# Patient Record
Sex: Female | Born: 1985 | Marital: Married | Smoking: Never smoker
Health system: Southern US, Community
[De-identification: ages and names within clinical notes are randomized; demographics above are authoritative.]

## PROBLEM LIST (undated history)

## (undated) DIAGNOSIS — Z789 Other specified health status: Secondary | ICD-10-CM

## (undated) HISTORY — PX: TONSILLECTOMY: SUR1361

---

## 2016-02-01 ENCOUNTER — Emergency Department: Payer: Managed Care, Other (non HMO)

## 2016-02-01 ENCOUNTER — Emergency Department
Admission: EM | Admit: 2016-02-01 | Discharge: 2016-02-01 | Disposition: A | Discharge status: Home / Self Care | Payer: Managed Care, Other (non HMO) | Attending: Emergency Medicine | Admitting: Emergency Medicine

## 2016-02-01 ENCOUNTER — Encounter: Payer: Self-pay | Admitting: Emergency Medicine

## 2016-02-01 DIAGNOSIS — O039 Complete or unspecified spontaneous abortion without complication: Secondary | ICD-10-CM

## 2016-02-01 DIAGNOSIS — R102 Pelvic and perineal pain: Secondary | ICD-10-CM | POA: Diagnosis not present

## 2016-02-01 LAB — CBC
HCT: 33.1 % — ABNORMAL LOW (ref 35.0–47.0)
HEMOGLOBIN: 10.9 g/dL — AB (ref 12.0–16.0)
MCH: 23.8 pg — AB (ref 26.0–34.0)
MCHC: 32.9 g/dL (ref 32.0–36.0)
MCV: 72.3 fL — ABNORMAL LOW (ref 80.0–100.0)
PLATELETS: 228 10*3/uL (ref 150–440)
RBC: 4.58 MIL/uL (ref 3.80–5.20)
RDW: 22.3 % — AB (ref 11.5–14.5)
WBC: 5.3 10*3/uL (ref 3.6–11.0)

## 2016-02-01 LAB — BASIC METABOLIC PANEL
Anion gap: 7 (ref 5–15)
BUN: 15 mg/dL (ref 6–20)
CALCIUM: 9.2 mg/dL (ref 8.9–10.3)
CO2: 23 mmol/L (ref 22–32)
CREATININE: 0.72 mg/dL (ref 0.44–1.00)
Chloride: 108 mmol/L (ref 101–111)
Glucose, Bld: 92 mg/dL (ref 65–99)
Potassium: 4 mmol/L (ref 3.5–5.1)
SODIUM: 138 mmol/L (ref 135–145)

## 2016-02-01 LAB — TYPE AND SCREEN
ABO/RH(D): B POS
ANTIBODY SCREEN: NEGATIVE

## 2016-02-01 LAB — HCG, QUANTITATIVE, PREGNANCY: HCG, BETA CHAIN, QUANT, S: 564 m[IU]/mL — AB (ref <5)

## 2016-02-01 MED ORDER — SODIUM CHLORIDE 0.9 % IV SOLN
Freq: Once | INTRAVENOUS | Status: AC
  Administered 2016-02-01: 1 mL via INTRAVENOUS

## 2016-02-01 MED ORDER — MORPHINE SULFATE (PF) 4 MG/ML IV SOLN
4.0000 mg | Freq: Once | INTRAVENOUS | Status: DC
  Filled 2016-02-01: qty 1

## 2016-02-01 MED ORDER — MORPHINE SULFATE (PF) 4 MG/ML IV SOLN: 4.0000 mg | Freq: Once | INTRAVENOUS | Status: DC

## 2016-02-01 MED ORDER — VARENICLINE TARTRATE 0.5 MG PO TABS: ORAL_TABLET | ORAL | 2 refills | Status: DC

## 2016-02-01 MED ORDER — OXYCODONE-ACETAMINOPHEN 5-325 MG PO TABS: 2.0000 | ORAL_TABLET | Freq: Four times a day (QID) | ORAL | 0 refills | Status: DC | PRN

## 2016-02-01 NOTE — ED Triage Notes (Signed)
Labs as noted, pt triage 2.

## 2016-02-01 NOTE — ED Triage Notes (Signed)
Pt arrived via EMS from home for reports of miscarriage. Pt reports saw OB approximately one week ago and was told her baby's heart stopped beating at approximately 9 weeks. Pt is 12 weeks per husband. Pt was given oral misoprostol and began having vaginal bleeding one week ago. Pt reports bleeding had slowed down but now has increased and is passing fist sized clots. Pt denies pain.

## 2016-02-01 NOTE — ED Notes (Signed)
Patient transported to Ultrasound 

## 2016-02-01 NOTE — ED Provider Notes (Signed)
[facility] Emergency Department Provider Note        Time seen: ----------------------------------------- 6:50 PM on 02/01/2016 -----------------------------------------    I have reviewed the triage vital signs and the nursing notes.   HISTORY  Chief Complaint Vaginal Bleeding    HPI Rose Edwards is a 30 y.o. female who presents to ER for concerns of miscarriage. Patient reportedly had fetal demise diagnosed last week by [facility]. Patient states she was taking Meza processed all Friday and Saturday of last week and began having bleeding and passing clots. The bleeding had initially slowed down but is now increased and now she is passing large clots. She complains of lower abdominal pain and foul smell   No past medical history on file.  There are no active problems to display for this patient.   No past surgical history on file.  Allergies Patient has no known allergies.  Social History Social History  Substance Use Topics  . Smoking status: Not on file  . Smokeless tobacco: Not on file  . Alcohol use Not on file    Review of Systems Constitutional: Negative for fever. Cardiovascular: Negative for chest pain. Respiratory: Negative for shortness of breath. Gastrointestinal: Positive for pelvic pain Genitourinary: Negative for dysuria.Positive for vaginal bleeding Musculoskeletal: Negative for back pain. Skin: Negative for rash. Neurological: Negative for headaches, focal weakness or numbness.  10-point ROS otherwise negative.  ____________________________________________   PHYSICAL EXAM:  VITAL SIGNS: ED Triage Vitals [02/01/16 1741]  Enc Vitals Group     BP 113/68     Pulse Rate 84     Resp 18     Temp 98 F (36.7 C)     Temp Source Oral     SpO2 100 %     Weight 100 lb (45.4 kg)     Height 5\' 1"  (1.549 m)     Head Circumference      Peak Flow      Pain Score      Pain Loc      Pain Edu?      Excl. in [place]?     Constitutional: Alert and oriented. No distress Eyes: Conjunctivae are normal. PERRL. Normal extraocular movements. ENT   Head: Normocephalic and atraumatic.   Nose: No congestion/rhinnorhea.   Mouth/Throat: Mucous membranes are moist.   Neck: No stridor. Cardiovascular: Normal rate, regular rhythm. No murmurs, rubs, or gallops. Respiratory: Normal respiratory effort without tachypnea nor retractions. Breath sounds are clear and equal bilaterally. No wheezes/rales/rhonchi. Gastrointestinal: Suprapubic tenderness, no rebound or guarding. Normal bowel sounds. Genitourinary: Large clot is present in the cervix Musculoskeletal: Nontender with normal range of motion in all extremities. No lower extremity tenderness nor edema. Neurologic:  Normal speech and language. No gross focal neurologic deficits are appreciated.  Skin:  Skin is warm, dry and intact. No rash noted. Psychiatric: Mood and affect are normal. Speech and behavior are normal.  ____________________________________________  ED COURSE:  Pertinent labs & imaging results that were available during my care of the patient were reviewed by me and considered in my medical decision making (see chart for details). Clinical Course   Patient presents to the ER with recent miscarriage and heavy bleeding. We will assess with labs and ultrasound.  I was able to remove the large clot present within the cervix using ringed forceps  Procedures ____________________________________________   LABS (pertinent positives/negatives)  Labs Reviewed  HCG, QUANTITATIVE, PREGNANCY - Abnormal; Notable for the following:  Result Value   hCG, Beta Chain, Quant, S 564 (*)    All other components within normal limits  CBC - Abnormal; Notable for the following:    Hemoglobin 10.9 (*)    HCT 33.1 (*)    MCV 72.3 (*)    MCH 23.8 (*)    RDW 22.3 (*)    All other components within normal limits  BASIC METABOLIC PANEL  TYPE AND  SCREEN    RADIOLOGY  Pelvic ultrasound IMPRESSION: No intrauterine gestational sac is identified. Thickened and heterogeneous endometrium likely representing hemorrhagic products. No flow to suggest retained products of conception.  ____________________________________________  FINAL ASSESSMENT AND PLAN  Miscarriage  Plan: Patient with labs and imaging as dictated above. Patient likely with complete passage of her miscarriage. I removed a large blood clot from her cervix which was likely causing some of her symptoms. She is currently feeling better. She will be discharged with pain medicine and advised to follow-up with OB/GYN as an outpatient.   Earleen Newport, MD   Note: This dictation was prepared with Dragon dictation. Any transcriptional errors that result from this process are unintentional    Earleen Newport, MD 02/01/16 2159

## 2016-02-01 NOTE — ED Notes (Signed)
Pt. Going home with husband. 

## 2016-09-08 ENCOUNTER — Ambulatory Visit (INDEPENDENT_AMBULATORY_CARE_PROVIDER_SITE_OTHER): Payer: 59 | Admitting: Advanced Practice Midwife

## 2016-09-08 ENCOUNTER — Encounter: Payer: Self-pay | Admitting: Advanced Practice Midwife

## 2016-09-08 VITALS — BP 98/52 | Wt 108.0 lb

## 2016-09-08 DIAGNOSIS — Z3480 Encounter for supervision of other normal pregnancy, unspecified trimester: Secondary | ICD-10-CM

## 2016-09-08 DIAGNOSIS — Z1379 Encounter for other screening for genetic and chromosomal anomalies: Secondary | ICD-10-CM

## 2016-09-08 DIAGNOSIS — Z113 Encounter for screening for infections with a predominantly sexual mode of transmission: Secondary | ICD-10-CM

## 2016-09-08 DIAGNOSIS — Z98891 History of uterine scar from previous surgery: Secondary | ICD-10-CM

## 2016-09-08 DIAGNOSIS — Z3490 Encounter for supervision of normal pregnancy, unspecified, unspecified trimester: Secondary | ICD-10-CM

## 2016-09-08 DIAGNOSIS — O0990 Supervision of high risk pregnancy, unspecified, unspecified trimester: Secondary | ICD-10-CM | POA: Insufficient documentation

## 2016-09-08 NOTE — Progress Notes (Signed)
New Obstetric Patient H&P    Chief Complaint: "Desires prenatal care"   History of Present Illness: Patient is a 31 y.o. [place] female, LMP 07/23/16 presents with amenorrhea and positive home pregnancy test. Based on her  LMP, her EDD is Estimated Date of Delivery: 04/29/2017. and her EGA is [redacted]w[redacted]d. Cycles are 7. days, regular, and occur approximately every : 28 days. Her last pap smear was 8 months ago and was no abnormalities.    She had a urine pregnancy test which was positive 2 week(s)  ago. Her last menstrual period was normal and lasted for  7 day(s). Since her LMP she claims she has experienced breast tenderness, fatigue, nausea and vomiting. She denies vaginal bleeding. Her past medical history is noncontributory. Her prior pregnancies are notable for cesarean section  Since her LMP, she admits to the use of tobacco products  no She claims she has gained   no pounds since the start of her pregnancy.  There are cats in the home in the home  no  She admits close contact with children on a regular basis  yes  She has had chicken pox in the past yes She has had Tuberculosis exposures, symptoms, or previously tested positive for TB   no Current or past history of domestic violence. no  Genetic Screening/Teratology Counseling: (Includes patient, baby's father, or anyone in either family with:)   85. Patient's age >/= 28 [facility]  no 2. Thalassemia ([place], [place], [place], or Asian background): MCV<80  no 3. Neural tube defect (meningomyelocele, spina bifida, anencephaly)  no 4. Congenital heart defect  no  5. Down syndrome  no 6. Tay-Sachs (Jewish, [place])  no 7. Canavan's Disease  no 8. Sickle cell disease or trait (African)  no  9. Hemophilia or other blood disorders  no  10. Muscular dystrophy  no  11. Cystic fibrosis  no  12. Huntington's Chorea  no  13. Mental retardation/autism  no 14. Other inherited genetic or chromosomal disorder  no 15.  Maternal metabolic disorder (DM, PKU, etc)  no 16. Patient or FOB with a child with a birth defect not listed above no  16a. Patient or FOB with a birth defect themselves no 17. Recurrent pregnancy loss, or stillbirth  no  18. Any medications since LMP other than prenatal vitamins (include vitamins, supplements, OTC meds, drugs, alcohol)  no 19. Any other genetic/environmental exposure to discuss  no  Infection History:   1. Lives with someone with TB or TB exposed  no  2. Patient or partner has history of genital herpes  no 3. Rash or viral illness since LMP  no 4. History of STI (GC, CT, HPV, syphilis, HIV)  no 5. History of recent travel :  no  Other pertinent information:  no     Review of Systems:10 point review of systems negative unless otherwise noted in HPI  Past Medical History:  History reviewed. No pertinent past medical history.  Past Surgical History:  Past Surgical History:  Procedure Laterality Date  . CESAREAN SECTION  2012   [place]    Gynecologic History: Patient's last menstrual period was 11/08/2015.  Obstetric History: G3P1011  Family History:  Family History  Problem Relation Age of Onset  . Hypertension Mother     Social History:  Social History   Social History  . Marital status: Married    Spouse name: N/A  . Number of children: N/A  . Years of  education: N/A   Occupational History  . Not on file.   Social History Main Topics  . Smoking status: Never Smoker  . Smokeless tobacco: Never Used  . Alcohol use No  . Drug use: No  . Sexual activity: Yes    Birth control/ protection: None   Other Topics Concern  . Not on file   Social History Narrative  . No narrative on file    Allergies:  No Known Allergies  Medications: Prior to Admission medications   Not on File    Physical Exam Vitals: Blood pressure (!) 98/52, weight 108 lb (49 kg), last menstrual period 11/08/2015.  General: NAD HEENT: normocephalic,  anicteric Thyroid: no enlargement, no palpable nodules Pulmonary: No increased work of breathing, CTAB Cardiovascular: RRR, distal pulses 2+ Abdomen: NABS, soft, non-tender, non-distended.  Umbilicus without lesions.  No hepatomegaly, splenomegaly or masses palpable. No evidence of hernia  Genitourinary:  External: Normal external female genitalia.  Normal urethral meatus, normal  Bartholin's and Skene's glands.    Vagina: Normal vaginal mucosa, no evidence of prolapse.    Cervix: Grossly normal in appearance, no bleeding, no CMT  Uterus: Enlarged, mobile, normal contour.    Adnexa: ovaries non-enlarged, no adnexal masses  Rectal: deferred Extremities: no edema, erythema, or tenderness Neurologic: Grossly intact Psychiatric: mood appropriate, affect full   Assessment: 31 y.o. G3P1011 at [redacted]w[redacted]d by LMP presenting to initiate prenatal care  Plan: 1) Avoid alcoholic beverages. 2) Patient encouraged not to smoke.  3) Discontinue the use of all non-medicinal drugs and chemicals.  4) Take prenatal vitamins daily.  5) Nutrition, food safety (fish, cheese advisories, and high nitrite foods) and exercise discussed. 6) Hospital and practice style discussed with cross coverage system.  7) Genetic Screening, such as with 1st Trimester Screening, cell free fetal DNA, AFP testing, and Ultrasound, as well as with amniocentesis and CVS as appropriate, is discussed with patient. At the conclusion of today's visit patient declined genetic testing 8) Patient is asked about travel to areas at risk for the [place] virus, and counseled to avoid travel and exposure to mosquitoes or sexual partners who may have themselves been exposed to the virus. Testing is discussed, and will be ordered as appropriate.    Rod Can, CNM

## 2016-09-08 NOTE — Progress Notes (Signed)
NOB 

## 2016-09-08 NOTE — Patient Instructions (Signed)

## 2016-09-10 LAB — GC/CHLAMYDIA PROBE AMP
CHLAMYDIA, DNA PROBE: NEGATIVE
NEISSERIA GONORRHOEAE BY PCR: NEGATIVE

## 2016-09-10 LAB — URINE CULTURE: ORGANISM ID, BACTERIA: NO GROWTH

## 2016-09-12 LAB — RPR+RH+ABO+RUB AB+AB SCR+CB...
HEMATOCRIT: 37.2 % (ref 34.0–46.6)
HEMOGLOBIN: 12.2 g/dL (ref 11.1–15.9)
HEP B S AG: NEGATIVE
HIV Screen 4th Generation wRfx: NONREACTIVE
MCH: 25.1 pg — ABNORMAL LOW (ref 26.6–33.0)
MCHC: 32.8 g/dL (ref 31.5–35.7)
MCV: 76 fL — ABNORMAL LOW (ref 79–97)
Platelets: 288 10*3/uL (ref 150–379)
RBC: 4.87 x10E6/uL (ref 3.77–5.28)
RDW: 16.9 % — ABNORMAL HIGH (ref 12.3–15.4)
RH TYPE: POSITIVE
RPR: NONREACTIVE
Rubella Antibodies, IGG: 24.4 index (ref >0.99)
Varicella zoster IgG: 3078 index (ref >165)
WBC: 4.7 10*3/uL (ref 3.4–10.8)

## 2016-09-12 LAB — HEMOGLOBINOPATHY EVALUATION
HEMOGLOBIN A2 QUANTITATION: 2.2 % (ref 1.8–3.2)
HEMOGLOBIN F QUANTITATION: 0 % (ref 0.0–2.0)
HGB C: 0 %
HGB S: 0 %
HGB VARIANT: 0 %
Hgb A: 97.8 % (ref 96.4–98.8)

## 2016-09-12 LAB — AB SCR+ANTIBODY ID: Antibody Screen: POSITIVE — AB

## 2016-09-22 ENCOUNTER — Ambulatory Visit (INDEPENDENT_AMBULATORY_CARE_PROVIDER_SITE_OTHER): Payer: 59

## 2016-09-22 ENCOUNTER — Ambulatory Visit (INDEPENDENT_AMBULATORY_CARE_PROVIDER_SITE_OTHER): Payer: 59 | Admitting: Obstetrics and Gynecology

## 2016-09-22 VITALS — BP 100/62 | Wt 108.0 lb

## 2016-09-22 DIAGNOSIS — Z3A08 8 weeks gestation of pregnancy: Secondary | ICD-10-CM

## 2016-09-22 DIAGNOSIS — Z3490 Encounter for supervision of normal pregnancy, unspecified, unspecified trimester: Secondary | ICD-10-CM

## 2016-09-22 DIAGNOSIS — O0990 Supervision of high risk pregnancy, unspecified, unspecified trimester: Secondary | ICD-10-CM

## 2016-09-22 DIAGNOSIS — Z98891 History of uterine scar from previous surgery: Secondary | ICD-10-CM

## 2016-09-22 DIAGNOSIS — R768 Other specified abnormal immunological findings in serum: Secondary | ICD-10-CM

## 2016-09-22 DIAGNOSIS — D259 Leiomyoma of uterus, unspecified: Secondary | ICD-10-CM

## 2016-09-22 DIAGNOSIS — O3410 Maternal care for benign tumor of corpus uteri, unspecified trimester: Secondary | ICD-10-CM

## 2016-09-22 DIAGNOSIS — Z362 Encounter for other antenatal screening follow-up: Secondary | ICD-10-CM

## 2016-09-22 NOTE — Progress Notes (Signed)
Prenatal Visit Note Date: 09/22/2016 Clinic: [facility]  Subjective:  Rose Edwards is a 31 y.o. G3P1011 at [redacted]w[redacted]d being seen today for ongoing prenatal care.  She is currently monitored for the following issues for this high-risk pregnancy and has Supervision of high risk pregnancy, antepartum; History of cesarean section, unknown scar; Red blood cell antibody positive; and Uterine fibroid in pregnancy on her problem list.  Patient reports no bleeding.   Contractions: Not present. Vag. Bleeding: None.   . Denies leaking of fluid.   The following portions of the patient's history were reviewed and updated as appropriate: allergies, current medications, past family history, past medical history, past social history, past surgical history and problem list. Problem list updated.  Objective:   Vitals:   09/22/16 1500  BP: 100/62  Weight: 108 lb (49 kg)    Fetal Status: Fetal Heart Rate (bpm): present         General:  Alert, oriented and cooperative. Patient is in no acute distress.  Skin: Skin is warm and dry. No rash noted.   Cardiovascular: Normal heart rate noted  Respiratory: Normal respiratory effort, no problems with respiration noted  Abdomen: Soft, gravid, appropriate for gestational age. Pain/Pressure: Absent     Pelvic:  Cervical exam deferred        Extremities: Normal range of motion.  Edema: None  Mental Status: Normal mood and affect. Normal behavior. Normal judgment and thought content.   Urinalysis:      Assessment and Plan:  Pregnancy: G3P1011 at [redacted]w[redacted]d  1. Supervision of high risk pregnancy, antepartum - declines genetic screening -discussed fibroid on uterus and [facility]. No current bleeding 2. History of cesarean section, unknown scar 3. Red blood cell antibody positive Repeat in 4 weeks to verify 4. Uterine fibroid in pregnancy Discussed significance.  Continue to monitor 5. [redacted] weeks gestation of pregnancy F/u 4 weeks  Please refer to After Visit Summary for  other counseling recommendations.  Return in about 4 weeks (around 10/20/2016) for Routine Prenatal Appointment.  Prentice Docker, MD 09/22/2016 3:26 PM

## 2016-10-20 ENCOUNTER — Encounter: Payer: Self-pay | Admitting: Obstetrics & Gynecology

## 2016-10-20 ENCOUNTER — Ambulatory Visit (INDEPENDENT_AMBULATORY_CARE_PROVIDER_SITE_OTHER): Payer: 59 | Admitting: Obstetrics & Gynecology

## 2016-10-20 VITALS — BP 100/60 | Wt 106.0 lb

## 2016-10-20 DIAGNOSIS — Z3A12 12 weeks gestation of pregnancy: Secondary | ICD-10-CM

## 2016-10-20 DIAGNOSIS — O0990 Supervision of high risk pregnancy, unspecified, unspecified trimester: Secondary | ICD-10-CM

## 2016-10-20 DIAGNOSIS — O3410 Maternal care for benign tumor of corpus uteri, unspecified trimester: Secondary | ICD-10-CM

## 2016-10-20 DIAGNOSIS — Z98891 History of uterine scar from previous surgery: Secondary | ICD-10-CM

## 2016-10-20 DIAGNOSIS — R768 Other specified abnormal immunological findings in serum: Secondary | ICD-10-CM

## 2016-10-20 DIAGNOSIS — D259 Leiomyoma of uterus, unspecified: Secondary | ICD-10-CM

## 2016-10-20 NOTE — Progress Notes (Signed)
PNV, Repeat AB test today, plans CS

## 2016-10-20 NOTE — Patient Instructions (Signed)

## 2016-10-30 LAB — AB SCR+ANTIBODY ID: Antibody Screen: POSITIVE — AB

## 2016-10-30 LAB — ANTIBODY SCREEN

## 2016-11-16 ENCOUNTER — Ambulatory Visit (INDEPENDENT_AMBULATORY_CARE_PROVIDER_SITE_OTHER): Payer: 59 | Admitting: Maternal Newborn

## 2016-11-16 VITALS — BP 100/50 | Wt 111.0 lb

## 2016-11-16 DIAGNOSIS — Z98891 History of uterine scar from previous surgery: Secondary | ICD-10-CM

## 2016-11-16 DIAGNOSIS — O3410 Maternal care for benign tumor of corpus uteri, unspecified trimester: Secondary | ICD-10-CM

## 2016-11-16 DIAGNOSIS — O0990 Supervision of high risk pregnancy, unspecified, unspecified trimester: Secondary | ICD-10-CM

## 2016-11-16 DIAGNOSIS — R768 Other specified abnormal immunological findings in serum: Secondary | ICD-10-CM

## 2016-11-16 DIAGNOSIS — Z3A16 16 weeks gestation of pregnancy: Secondary | ICD-10-CM

## 2016-11-16 DIAGNOSIS — D259 Leiomyoma of uterus, unspecified: Secondary | ICD-10-CM

## 2016-11-16 NOTE — Progress Notes (Signed)
Routine Prenatal Care Visit  Subjective  Rose Edwards is a 31 y.o. G3P1011 at [redacted]w[redacted]d being seen today for ongoing prenatal care.  She is currently monitored for the following issues for this high-risk pregnancy and has Supervision of high risk pregnancy, antepartum; History of cesarean section, unknown scar; Red blood cell antibody positive; and Uterine fibroid in pregnancy on her problem list.  ----------------------------------------------------------------------------------- Patient reports no complaints.   Contractions: Not present. Vag. Bleeding: None. Denies leaking of fluid.  ----------------------------------------------------------------------------------- The following portions of the patient's history were reviewed and updated as appropriate: allergies, current medications, past family history, past medical history, past social history, past surgical history and problem list. Problem list updated.   Objective  Last menstrual period 07/23/2016. Pregravid weight 108 lb (49 kg) Total Weight Gain 3 lb (1.361 kg) Urinalysis: Urine Protein: Negative Urine Glucose: Negative  Fetal Status: Fetal Heart Rate (bpm): 144         General:  Alert, oriented and cooperative. Patient is in no acute distress.  Skin: Skin is warm and dry. No rash noted.   Cardiovascular: Normal heart rate noted  Respiratory: Normal respiratory effort, no problems with respiration noted  Abdomen: Soft, gravid, appropriate for gestational age. Pain/Pressure: Absent     Pelvic:  Cervical exam deferred        Extremities: Normal range of motion.  Edema: None  ental Status: Normal mood and affect. Normal behavior. Normal judgment and thought content.     Assessment   31 y.o. G4W1027 at [redacted]w[redacted]d by  04/29/2017, by Last Menstrual Period presenting for routine prenatal visit  Plan   Pregnancy#3 Problems (from 07/23/16 to present)    Problem Noted Resolved   Red blood cell antibody positive 09/22/2016 by Will Bonnet, MD No   Uterine fibroid in pregnancy 09/22/2016 by Will Bonnet, MD No   Supervision of high risk pregnancy, antepartum 09/08/2016 by Rod Can, CNM No   Overview Signed 09/08/2016  4:30 PM by Rod Can, [place] Prenatal Labs  Dating  Blood type: --/--/B POS (11/25 1744)   Genetic Screen 1 Screen:    AFP:     Quad:     NIPS: Antibody: Positive 8/14  Anatomic [place]  Rubella:   Varicella: Immune  GTT Early:               Third trimester:  RPR:   Non Reactive  Rhogam  HBsAg: Negative    TDaP vaccine                       Flu Shot: HIV:     Baby Food                                GBS:   Contraception  Pap:  CBB     CS/VBAC    Support Person              History of cesarean section, unknown scar 09/08/2016 by Rod Can, CNM No   Overview Signed 09/08/2016  4:31 PM by Rod Can, CNM    Surgery done in [place]          Preterm labor symptoms and general obstetric precautions including but not limited to vaginal bleeding, contractions, leaking of fluid and fetal movement were reviewed in detail with the patient. Please refer to After Visit Summary for other counseling recommendations.  Return in about 2 weeks (around 11/30/2016) for Anatomy US and [place].   Avel Sensor, CNM 11/16/2016  9:18 AM

## 2016-11-16 NOTE — Progress Notes (Signed)
No c/o

## 2016-11-17 ENCOUNTER — Encounter: Payer: 59 | Admitting: Obstetrics & Gynecology

## 2016-12-02 ENCOUNTER — Ambulatory Visit (INDEPENDENT_AMBULATORY_CARE_PROVIDER_SITE_OTHER): Payer: 59

## 2016-12-02 ENCOUNTER — Ambulatory Visit (INDEPENDENT_AMBULATORY_CARE_PROVIDER_SITE_OTHER): Payer: 59 | Admitting: Advanced Practice Midwife

## 2016-12-02 VITALS — BP 100/60 | Wt 111.0 lb

## 2016-12-02 DIAGNOSIS — Z362 Encounter for other antenatal screening follow-up: Secondary | ICD-10-CM | POA: Diagnosis not present

## 2016-12-02 DIAGNOSIS — O0990 Supervision of high risk pregnancy, unspecified, unspecified trimester: Secondary | ICD-10-CM | POA: Diagnosis not present

## 2016-12-02 DIAGNOSIS — Z3A18 18 weeks gestation of pregnancy: Secondary | ICD-10-CM

## 2016-12-02 NOTE — Progress Notes (Signed)
Anatomy scan today is complete. Uterine fibroid is unchanged. Complaint of sciatica pain. Recommend stretching, heat/ice, abdominal support band, hydrotherapy. She is unsure if she has felt fetal movement yet. She admits appetite but has only gained 3 pounds so far. She is encouraged to eat healthy foods and stay well hydrated. Denies contractions, LOF, VB. ROB in 4 weeks.

## 2016-12-30 ENCOUNTER — Ambulatory Visit (INDEPENDENT_AMBULATORY_CARE_PROVIDER_SITE_OTHER): Payer: 59 | Admitting: Maternal Newborn

## 2016-12-30 VITALS — BP 98/54 | Wt 118.0 lb

## 2016-12-30 DIAGNOSIS — R768 Other specified abnormal immunological findings in serum: Secondary | ICD-10-CM

## 2016-12-30 DIAGNOSIS — O0990 Supervision of high risk pregnancy, unspecified, unspecified trimester: Secondary | ICD-10-CM

## 2016-12-30 DIAGNOSIS — Z3A22 22 weeks gestation of pregnancy: Secondary | ICD-10-CM

## 2016-12-30 NOTE — Patient Instructions (Signed)

## 2016-12-30 NOTE — Progress Notes (Signed)
Routine Prenatal Care Visit  Subjective  Rose Edwards is a 31 y.o. G3P1011 at [redacted]w[redacted]d being seen today for ongoing prenatal care.  She is currently monitored for the following issues for this high-risk pregnancy and has Supervision of high risk pregnancy, antepartum; History of cesarean section, unknown scar; Red blood cell antibody positive; and Uterine fibroid in pregnancy on her problem list.  ----------------------------------------------------------------------------------- Patient reports some dizziness and headaches when abruptly standing up.  Contractions: Not present. Vag. Bleeding: None.  Movement: Present. Denies leaking of fluid.  ----------------------------------------------------------------------------------- The following portions of the patient's history were reviewed and updated as appropriate: allergies, current medications, past family history, past medical history, past social history, past surgical history and problem list. Problem list updated.   Objective  Blood pressure (!) 98/54, weight 118 lb (53.5 kg), last menstrual period 07/23/2016. Pregravid weight 108 lb (49 kg) Total Weight Gain 10 lb (4.536 kg) Urinalysis: Urine Protein: Negative Urine Glucose: Negative  Fetal Status: Fetal Heart Rate (bpm): 142 Fundal Height: 23 cm Movement: Present     General:  Alert, oriented and cooperative. Patient is in no acute distress.  Skin: Skin is warm and dry. No rash noted.   Cardiovascular: Normal heart rate noted  Respiratory: Normal respiratory effort, no problems with respiration noted  Abdomen: Soft, gravid, appropriate for gestational age. Pain/Pressure: Absent     Pelvic:  Cervical exam deferred        Extremities: Normal range of motion.  Edema: None  Mental Status: Normal mood and affect. Normal behavior. Normal judgment and thought content.     Assessment   32 y.o. [id number] at [redacted]w[redacted]d by  04/29/2017, by Last Menstrual Period presenting for routine prenatal  visit.  Plan   Pregnancy#3 Problems (from 07/23/16 to present)    Problem Noted Resolved   Red blood cell antibody positive 09/22/2016 by Will Bonnet, MD No   Uterine fibroid in pregnancy 09/22/2016 by Will Bonnet, MD No   Supervision of high risk pregnancy, antepartum 09/08/2016 by Rod Can, CNM No   Overview Addendum 11/16/2016  9:18 AM by Rexene Agent, [place] Prenatal Labs  Dating  Blood type: B/Positive/-- (07/03 1605)   Genetic Screen 1 Screen:    AFP:     Quad:     NIPS: Antibody:Positive, See Final Results (08/14 1116)  Anatomic [place]  Rubella: 24.40 (07/03 1605) Varicella: I  GTT Early:               Third trimester:  RPR: Non Reactive (07/03 1605)   Rhogam  HBsAg: Negative (07/03 1605)   TDaP vaccine                       Flu Shot: HIV:     Baby Food                                GBS:   Contraception  Pap:  CBB     CS/VBAC    Support Person              History of cesarean section, unknown scar 09/08/2016 by Rod Can, CNM No   Overview Signed 09/08/2016  4:31 PM by Rod Can, CNM    Surgery done in [place]       Advised good hydration and rising slowly to prevent postural hypotension symptoms.   Preterm labor symptoms and  general obstetric precautions including but not limited to vaginal bleeding, contractions, leaking of fluid and fetal movement were reviewed in detail with the patient. Please refer to After Visit Summary for other counseling recommendations.   Return in about 4 weeks (around 01/27/2017) for ROB.  Avel Sensor, CNM 12/30/2016  9:31 AM

## 2017-01-27 ENCOUNTER — Ambulatory Visit (INDEPENDENT_AMBULATORY_CARE_PROVIDER_SITE_OTHER): Payer: 59 | Admitting: Obstetrics and Gynecology

## 2017-01-27 VITALS — BP 96/50 | Wt 122.0 lb

## 2017-01-27 DIAGNOSIS — Z3A26 26 weeks gestation of pregnancy: Secondary | ICD-10-CM

## 2017-01-27 DIAGNOSIS — O0990 Supervision of high risk pregnancy, unspecified, unspecified trimester: Secondary | ICD-10-CM

## 2017-01-27 DIAGNOSIS — Z23 Encounter for immunization: Secondary | ICD-10-CM | POA: Diagnosis not present

## 2017-01-27 DIAGNOSIS — Z113 Encounter for screening for infections with a predominantly sexual mode of transmission: Secondary | ICD-10-CM

## 2017-01-27 NOTE — Progress Notes (Signed)
Routine Prenatal Care Visit  Subjective  Rose Edwards is a 31 y.o. G3P1011 at [redacted]w[redacted]d being seen today for ongoing prenatal care.  She is currently monitored for the following issues for this low-risk pregnancy and has Supervision of high risk pregnancy, antepartum; History of cesarean section, unknown scar; Red blood cell antibody positive; and Uterine fibroid in pregnancy on their problem list.  ----------------------------------------------------------------------------------- Patient reports no complaints.    .  .   . Denies leaking of fluid.  ----------------------------------------------------------------------------------- The following portions of the patient's history were reviewed and updated as appropriate: allergies, current medications, past family history, past medical history, past social history, past surgical history and problem list. Problem list updated.   Objective  Blood pressure (!) 96/50, weight 122 lb (55.3 kg), last menstrual period 07/23/2016. Pregravid weight 108 lb (49 kg) Total Weight Gain 14 lb (6.35 kg) Urinalysis:      Fetal Status:           General:  Alert, oriented and cooperative. Patient is in no acute distress.  Skin: Skin is warm and dry. No rash noted.   Cardiovascular: Normal heart rate noted  Respiratory: Normal respiratory effort, no problems with respiration noted  Abdomen: Soft, gravid, appropriate for gestational age.       Pelvic:  Cervical exam deferred        Extremities: Normal range of motion.     ental Status: Normal mood and affect. Normal behavior. Normal judgment and thought content.     Assessment   31 y.o. G3P1011 at [redacted]w[redacted]d by  04/29/2017, by Last Menstrual Period presenting for routine prenatal visit  Plan   Pregnancy#3 Problems (from 07/23/16 to present)    Problem Noted Resolved   Red blood cell antibody positive 09/22/2016 by Will Bonnet, MD No   Uterine fibroid in pregnancy 09/22/2016 by Will Bonnet, MD No    Supervision of high risk pregnancy, antepartum 09/08/2016 by Rod Can, CNM No   Overview Addendum 01/26/2017 10:05 PM by Malachy Mood, MD    Clinic [facility] Prenatal Labs  Dating LMP = 8 week [place] Blood type: B/Positive/-- (07/03 1605)   Genetic Screen Declined Antibody:Positive, See Final Results (08/14 1116)  Anatomic [place] Normal female, 7.5cm fibroid  Left posterior lower uterine segment Rubella: 24.40 (07/03 1605) Varicella: I  GTT Early:               Third trimester:  RPR: Non Reactive (07/03 1605)   Rhogam N/A HBsAg: Negative (07/03 1605)   TDaP vaccine                       Flu Shot: HIV: negative  Baby Food                                GBS:   Contraception  Pap: 12/31/2015 NIL  CBB     CS/VBAC    Support Person              History of cesarean section, unknown scar 09/08/2016 by Rod Can, CNM No   Overview Signed 09/08/2016  4:31 PM by Rod Can, CNM    Surgery done in [place]          Preterm labor symptoms and general obstetric precautions including but not limited to vaginal bleeding, contractions, leaking of fluid and fetal movement were reviewed in detail with the patient. Please refer to After  Visit Summary for other counseling recommendations.  - 28 week labs in 2 week with Rh negative panel to obtain repeat T&S - influenza vaccination today Return in about 2 weeks (around 02/10/2017) for ROB and 28 week labs.

## 2017-01-27 NOTE — Progress Notes (Signed)
ROB

## 2017-02-10 ENCOUNTER — Other Ambulatory Visit: Payer: 59

## 2017-02-10 ENCOUNTER — Encounter: Payer: 59 | Admitting: Maternal Newborn

## 2017-02-12 ENCOUNTER — Encounter: Payer: Self-pay | Admitting: Advanced Practice Midwife

## 2017-02-12 ENCOUNTER — Ambulatory Visit (INDEPENDENT_AMBULATORY_CARE_PROVIDER_SITE_OTHER): Payer: 59 | Admitting: Advanced Practice Midwife

## 2017-02-12 ENCOUNTER — Other Ambulatory Visit: Payer: 59

## 2017-02-12 VITALS — BP 94/58 | Wt 124.0 lb

## 2017-02-12 DIAGNOSIS — Z3A29 29 weeks gestation of pregnancy: Secondary | ICD-10-CM

## 2017-02-12 DIAGNOSIS — O0990 Supervision of high risk pregnancy, unspecified, unspecified trimester: Secondary | ICD-10-CM

## 2017-02-12 DIAGNOSIS — Z113 Encounter for screening for infections with a predominantly sexual mode of transmission: Secondary | ICD-10-CM

## 2017-02-12 NOTE — Progress Notes (Signed)
28 wk labs. No complaints.

## 2017-02-12 NOTE — Patient Instructions (Signed)
Third Trimester of Pregnancy The third trimester is from week 28 through week 40 (months 7 through 9). The third trimester is a time when the unborn baby (fetus) is growing rapidly. At the end of the ninth month, the fetus is about 20 inches in length and weighs 6-10 pounds. Body changes during your third trimester Your body will continue to go through many changes during pregnancy. The changes vary from woman to woman. During the third trimester:  Your weight will continue to increase. You can expect to gain 25-35 pounds (11-16 kg) by the end of the pregnancy.  You may begin to get stretch marks on your hips, abdomen, and breasts.  You may urinate more often because the fetus is moving lower into your pelvis and pressing on your bladder.  You may develop or continue to have heartburn. This is caused by increased hormones that slow down muscles in the digestive tract.  You may develop or continue to have constipation because increased hormones slow digestion and cause the muscles that push waste through your intestines to relax.  You may develop hemorrhoids. These are swollen veins (varicose veins) in the rectum that can itch or be painful.  You may develop swollen, bulging veins (varicose veins) in your legs.  You may have increased body aches in the pelvis, back, or thighs. This is due to weight gain and increased hormones that are relaxing your joints.  You may have changes in your hair. These can include thickening of your hair, rapid growth, and changes in texture. Some women also have hair loss during or after pregnancy, or hair that feels dry or thin. Your hair will most likely return to normal after your baby is born.  Your breasts will continue to grow and they will continue to become tender. A yellow fluid (colostrum) may leak from your breasts. This is the first milk you are producing for your baby.  Your belly button may stick out.  You may notice more swelling in your hands,  face, or ankles.  You may have increased tingling or numbness in your hands, arms, and legs. The skin on your belly may also feel numb.  You may feel short of breath because of your expanding uterus.  You may have more problems sleeping. This can be caused by the size of your belly, increased need to urinate, and an increase in your body's metabolism.  You may notice the fetus "dropping," or moving lower in your abdomen (lightening).  You may have increased vaginal discharge.  You may notice your joints feel loose and you may have pain around your pelvic bone.  What to expect at prenatal visits You will have prenatal exams every 2 weeks until week 36. Then you will have weekly prenatal exams. During a routine prenatal visit:  You will be weighed to make sure you and the baby are growing normally.  Your blood pressure will be taken.  Your abdomen will be measured to track your baby's growth.  The fetal heartbeat will be listened to.  Any test results from the previous visit will be discussed.  You may have a cervical check near your due date to see if your cervix has softened or thinned (effaced).  You will be tested for Group B streptococcus. This happens between 35 and 37 weeks.  Your health care provider may ask you:  What your birth plan is.  How you are feeling.  If you are feeling the baby move.  If you have had   any abnormal symptoms, such as leaking fluid, bleeding, severe headaches, or abdominal cramping.  If you are using any tobacco products, including cigarettes, chewing tobacco, and electronic cigarettes.  If you have any questions.  Other tests or screenings that may be performed during your third trimester include:  Blood tests that check for low iron levels (anemia).  Fetal testing to check the health, activity level, and growth of the fetus. Testing is done if you have certain medical conditions or if there are problems during the  pregnancy.  Nonstress test (NST). This test checks the health of your baby to make sure there are no signs of problems, such as the baby not getting enough oxygen. During this test, a belt is placed around your belly. The baby is made to move, and its heart rate is monitored during movement.  What is false labor? False labor is a condition in which you feel small, irregular tightenings of the muscles in the womb (contractions) that usually go away with rest, changing position, or drinking water. These are called Braxton Hicks contractions. Contractions may last for hours, days, or even weeks before true labor sets in. If contractions come at regular intervals, become more frequent, increase in intensity, or become painful, you should see your health care provider. What are the signs of labor?  Abdominal cramps.  Regular contractions that start at 10 minutes apart and become stronger and more frequent with time.  Contractions that start on the top of the uterus and spread down to the lower abdomen and back.  Increased pelvic pressure and dull back pain.  A watery or bloody mucus discharge that comes from the vagina.  Leaking of amniotic fluid. This is also known as your "water breaking." It could be a slow trickle or a gush. Let your health care provider know if it has a color or strange odor. If you have any of these signs, call your health care provider right away, even if it is before your due date. Follow these instructions at home: Medicines  Follow your health care provider's instructions regarding medicine use. Specific medicines may be either safe or unsafe to take during pregnancy.  Take a prenatal vitamin that contains at least 600 micrograms (mcg) of folic acid.  If you develop constipation, try taking a stool softener if your health care provider approves. Eating and drinking  Eat a balanced diet that includes fresh fruits and vegetables, whole grains, good sources of protein  such as meat, eggs, or tofu, and low-fat dairy. Your health care provider will help you determine the amount of weight gain that is right for you.  Avoid raw meat and uncooked cheese. These carry germs that can cause birth defects in the baby.  If you have low calcium intake from food, talk to your health care provider about whether you should take a daily calcium supplement.  Eat four or five small meals rather than three large meals a day.  Limit foods that are high in fat and processed sugars, such as fried and sweet foods.  To prevent constipation: ? Drink enough fluid to keep your urine clear or pale yellow. ? Eat foods that are high in fiber, such as fresh fruits and vegetables, whole grains, and beans. Activity  Exercise only as directed by your health care provider. Most women can continue their usual exercise routine during pregnancy. Try to exercise for 30 minutes at least 5 days a week. Stop exercising if you experience uterine contractions.  Avoid heavy   lifting.  Do not exercise in extreme heat or humidity, or at high altitudes.  Wear low-heel, comfortable shoes.  Practice good posture.  You may continue to have sex unless your health care provider tells you otherwise. Relieving pain and discomfort  Take frequent breaks and rest with your legs elevated if you have leg cramps or low back pain.  Take warm sitz baths to soothe any pain or discomfort caused by hemorrhoids. Use hemorrhoid cream if your health care provider approves.  Wear a good support bra to prevent discomfort from breast tenderness.  If you develop varicose veins: ? Wear support pantyhose or compression stockings as told by your healthcare provider. ? Elevate your feet for 15 minutes, 3-4 times a day. Prenatal care  Write down your questions. Take them to your prenatal visits.  Keep all your prenatal visits as told by your health care provider. This is important. Safety  Wear your seat belt at  all times when driving.  Make a list of emergency phone numbers, including numbers for family, friends, the hospital, and police and fire departments. General instructions  Avoid cat litter boxes and soil used by cats. These carry germs that can cause birth defects in the baby. If you have a cat, ask someone to clean the litter box for you.  Do not travel far distances unless it is absolutely necessary and only with the approval of your health care provider.  Do not use hot tubs, steam rooms, or saunas.  Do not drink alcohol.  Do not use any products that contain nicotine or tobacco, such as cigarettes and e-cigarettes. If you need help quitting, ask your health care provider.  Do not use any medicinal herbs or unprescribed drugs. These chemicals affect the formation and growth of the baby.  Do not douche or use tampons or scented sanitary pads.  Do not cross your legs for long periods of time.  To prepare for the arrival of your baby: ? Take prenatal classes to understand, practice, and ask questions about labor and delivery. ? Make a trial run to the hospital. ? Visit the hospital and tour the maternity area. ? Arrange for maternity or paternity leave through employers. ? Arrange for family and friends to take care of pets while you are in the hospital. ? Purchase a rear-facing car seat and make sure you know how to install it in your car. ? Pack your hospital bag. ? Prepare the baby's nursery. Make sure to remove all pillows and stuffed animals from the baby's crib to prevent suffocation.  Visit your dentist if you have not gone during your pregnancy. Use a soft toothbrush to brush your teeth and be gentle when you floss. Contact a health care provider if:  You are unsure if you are in labor or if your water has broken.  You become dizzy.  You have mild pelvic cramps, pelvic pressure, or nagging pain in your abdominal area.  You have lower back pain.  You have persistent  nausea, vomiting, or diarrhea.  You have an unusual or bad smelling vaginal discharge.  You have pain when you urinate. Get help right away if:  Your water breaks before 37 weeks.  You have regular contractions less than 5 minutes apart before 37 weeks.  You have a fever.  You are leaking fluid from your vagina.  You have spotting or bleeding from your vagina.  You have severe abdominal pain or cramping.  You have rapid weight loss or weight gain.    You have shortness of breath with chest pain.  You notice sudden or extreme swelling of your face, hands, ankles, feet, or legs.  Your baby makes fewer than 10 movements in 2 hours.  You have severe headaches that do not go away when you take medicine.  You have vision changes. Summary  The third trimester is from week 28 through week 40, months 7 through 9. The third trimester is a time when the unborn baby (fetus) is growing rapidly.  During the third trimester, your discomfort may increase as you and your baby continue to gain weight. You may have abdominal, leg, and back pain, sleeping problems, and an increased need to urinate.  During the third trimester your breasts will keep growing and they will continue to become tender. A yellow fluid (colostrum) may leak from your breasts. This is the first milk you are producing for your baby.  False labor is a condition in which you feel small, irregular tightenings of the muscles in the womb (contractions) that eventually go away. These are called Braxton Hicks contractions. Contractions may last for hours, days, or even weeks before true labor sets in.  Signs of labor can include: abdominal cramps; regular contractions that start at 10 minutes apart and become stronger and more frequent with time; watery or bloody mucus discharge that comes from the vagina; increased pelvic pressure and dull back pain; and leaking of amniotic fluid. This information is not intended to replace advice  given to you by your health care provider. Make sure you discuss any questions you have with your health care provider. Document Released: 02/17/2001 Document Revised: 08/01/2015 Document Reviewed: 04/26/2012 Elsevier Interactive Patient Education  2017 [employer]  

## 2017-02-12 NOTE — Progress Notes (Signed)
Routine Prenatal Care Visit  Subjective  Rose Edwards is a 31 y.o. G3P1011 at [redacted]w[redacted]d being seen today for ongoing prenatal care.  She is currently monitored for the following issues for this high-risk pregnancy and has Supervision of high risk pregnancy, antepartum; History of cesarean section, unknown scar; Red blood cell antibody positive; and Uterine fibroid in pregnancy on their problem list.  ----------------------------------------------------------------------------------- Patient reports no complaints.   Contractions: Not present.  .  Movement: Present. Denies leaking of fluid. Denies vaginal bleeding. ----------------------------------------------------------------------------------- The following portions of the patient's history were reviewed and updated as appropriate: allergies, current medications, past family history, past medical history, past social history, past surgical history and problem list. Problem list updated.   Objective  Blood pressure (!) 94/58, weight 124 lb (56.2 kg), last menstrual period 07/23/2016. Pregravid weight 108 lb (49 kg) Total Weight Gain 16 lb (7.258 kg) Urinalysis: Urine Protein: Negative Urine Glucose: Negative  Fetal Status: Fetal Heart Rate (bpm): 136 Fundal Height: 29 cm Movement: Present     General:  Alert, oriented and cooperative. Patient is in no acute distress.  Skin: Skin is warm and dry. No rash noted.   Cardiovascular: Normal heart rate noted  Respiratory: Normal respiratory effort, no problems with respiration noted  Abdomen: Soft, gravid, appropriate for gestational age. Pain/Pressure: Absent     Pelvic:  Cervical exam deferred        Extremities: Normal range of motion.     Mental Status: Normal mood and affect. Normal behavior. Normal judgment and thought content.   Assessment   31 y.o. G3P1011 at [redacted]w[redacted]d by  04/29/2017, by Last Menstrual Period presenting for routine prenatal visit  Plan   Pregnancy#3 Problems (from 07/23/16  to present)    Problem Noted Resolved   Red blood cell antibody positive 09/22/2016 by Will Bonnet, MD No   Uterine fibroid in pregnancy 09/22/2016 by Will Bonnet, MD No   Supervision of high risk pregnancy, antepartum 09/08/2016 by Rod Can, CNM No   Overview Addendum 01/27/2017  9:13 AM by Malachy Mood, MD    [facility]inic [facility] Prenatal Labs  Dating LMP = 8 week [place] Blood type: B/Positive/-- (07/03 1605)   Genetic Screen Declined Antibody:Positive, See Final Results (08/14 1116)  Anatomic [place] Normal female, 7.5cm fibroid  Left posterior lower uterine segment Rubella: 24.40 (07/03 1605) Varicella: I  GTT Third trimester: 12/7 RPR: Non Reactive (07/03 1605)   Rhogam N/A HBsAg: Negative (07/03 1605)   TDaP vaccine                       Flu Shot: 01/27/17 HIV: negative  Baby Food                                GBS:   Contraception  Pap: 12/31/2015 NIL  CBB     CS/VBAC Repeat sched 04/22/2017   Support Person husband             History of cesarean section, unknown scar 09/08/2016 by Rod Can, CNM No   Overview Signed 09/08/2016  4:31 PM by Rod Can, CNM    Surgery done in [place]          Preterm labor symptoms and general obstetric precautions including but not limited to vaginal bleeding, contractions, leaking of fluid and fetal movement were reviewed in detail with the patient. Please refer to After Visit Summary for other counseling  recommendations.   Return in about 2 weeks (around 02/26/2017) for rob.  Rod Can, CNM  02/12/2017 9:34 AM

## 2017-02-13 LAB — 28 WEEKS RH-PANEL
Antibody Screen: NEGATIVE
Basophils Absolute: 0 10*3/uL (ref 0.0–0.2)
Basos: 0 %
EOS (ABSOLUTE): 0.1 10*3/uL (ref 0.0–0.4)
EOS: 2 %
GESTATIONAL DIABETES SCREEN: 132 mg/dL (ref 65–139)
HEMATOCRIT: 32.9 % — AB (ref 34.0–46.6)
HEMOGLOBIN: 10.8 g/dL — AB (ref 11.1–15.9)
HIV SCREEN 4TH GENERATION: NONREACTIVE
Immature Grans (Abs): 0 10*3/uL (ref 0.0–0.1)
Immature Granulocytes: 1 %
LYMPHS ABS: 1 10*3/uL (ref 0.7–3.1)
Lymphs: 18 %
MCH: 26.5 pg — AB (ref 26.6–33.0)
MCHC: 32.8 g/dL (ref 31.5–35.7)
MCV: 81 fL (ref 79–97)
Monocytes Absolute: 0.3 10*3/uL (ref 0.1–0.9)
Monocytes: 5 %
NEUTROS ABS: 4.1 10*3/uL (ref 1.4–7.0)
Neutrophils: 74 %
PLATELETS: 153 10*3/uL (ref 150–379)
RBC: 4.08 x10E6/uL (ref 3.77–5.28)
RDW: 15.3 % (ref 12.3–15.4)
RPR: NONREACTIVE
WBC: 5.6 10*3/uL (ref 3.4–10.8)

## 2017-02-18 ENCOUNTER — Telehealth: Payer: Self-pay | Admitting: Obstetrics & Gynecology

## 2017-02-18 NOTE — Telephone Encounter (Signed)
Lmtrc

## 2017-02-18 NOTE — Telephone Encounter (Signed)
Patient's husband, Inda Merlin (on [place]), is aware.

## 2017-02-18 NOTE — Telephone Encounter (Signed)
-----   Message from Rod Can, CNM sent at 02/12/2017  2:23 PM EST ----- Surgery Booking Request Patient Full Name:  Rose Edwards MRN: [id number]  DOB: October 22, 1985  Surgeon: Georgianne Fick or Kenton Kingfisher (both are in the [place] but Georgianne Fick is also on call)  Requested Surgery Date and Time: 04/22/2017 Primary Diagnosis AND Code: Repeat c/s Secondary Diagnosis and Code:  Surgical Procedure: Cesarean Section L&D Notification: Yes Admission Status: surgery admit Length of Surgery: 95 m Special Case Needs: On Q pump H&P:  (date) Phone Interview???:  Interpreter: Language:  Medical Clearance:  Special Scheduling Instructions:

## 2017-02-26 ENCOUNTER — Ambulatory Visit (INDEPENDENT_AMBULATORY_CARE_PROVIDER_SITE_OTHER): Payer: 59 | Admitting: Obstetrics and Gynecology

## 2017-02-26 VITALS — BP 104/66 | Wt 128.0 lb

## 2017-02-26 DIAGNOSIS — O0990 Supervision of high risk pregnancy, unspecified, unspecified trimester: Secondary | ICD-10-CM | POA: Diagnosis not present

## 2017-02-26 DIAGNOSIS — Z3A30 30 weeks gestation of pregnancy: Secondary | ICD-10-CM

## 2017-02-26 DIAGNOSIS — O26843 Uterine size-date discrepancy, third trimester: Secondary | ICD-10-CM

## 2017-02-26 DIAGNOSIS — Z23 Encounter for immunization: Secondary | ICD-10-CM | POA: Diagnosis not present

## 2017-02-26 NOTE — Progress Notes (Signed)
Routine Prenatal Care Visit  Subjective  Rose Edwards is a 31 y.o. G3P1011 at [redacted]w[redacted]d being seen today for ongoing prenatal care.  She is currently monitored for the following issues for this high-risk pregnancy and has Supervision of high risk pregnancy, antepartum; History of cesarean section, unknown scar; Red blood cell antibody positive; and Uterine fibroid in pregnancy on their problem list.  ----------------------------------------------------------------------------------- Patient reports no complaints.   Contractions: Not present. Vag. Bleeding: None.  Movement: Present. Denies leaking of fluid.  ----------------------------------------------------------------------------------- The following portions of the patient's history were reviewed and updated as appropriate: allergies, current medications, past family history, past medical history, past social history, past surgical history and problem list. Problem list updated.   Objective  Blood pressure 104/66, weight 128 lb (58.1 kg), last menstrual period 07/23/2016. Pregravid weight 108 lb (49 kg) Total Weight Gain 20 lb (9.072 kg) Urinalysis: Urine Protein: Negative Urine Glucose: Negative  Fetal Status: Fetal Heart Rate (bpm): 140 Fundal Height: 28 cm Movement: Present     General:  Alert, oriented and cooperative. Patient is in no acute distress.  Skin: Skin is warm and dry. No rash noted.   Cardiovascular: Normal heart rate noted  Respiratory: Normal respiratory effort, no problems with respiration noted  Abdomen: Soft, gravid, appropriate for gestational age. Pain/Pressure: Absent     Pelvic:  Cervical exam deferred        Extremities: Normal range of motion.     ental Status: Normal mood and affect. Normal behavior. Normal judgment and thought content.     Assessment   31 y.o. [id number] at [redacted]w[redacted]d by  04/29/2017, by Last Menstrual Period presenting for routine prenatal visit  Plan   Pregnancy#3 Problems (from 07/23/16 to  present)    Problem Noted Resolved   Red blood cell antibody positive 09/22/2016 by Will Bonnet, MD No   Uterine fibroid in pregnancy 09/22/2016 by Will Bonnet, MD No   Supervision of high risk pregnancy, antepartum 09/08/2016 by Rod Can, CNM No   Overview Addendum 02/26/2017 10:09 AM by Malachy Mood, MD    [facility] Prenatal Labs  Dating LMP = 8 week [place] Blood type: B/Positive/-- (07/03 1605)   Genetic Screen Declined Antibody:Positive, See Final Results (08/14 1116)  Anatomic [place] Normal female, 7.5cm fibroid  Left posterior lower uterine segment Rubella: 24.40 (07/03 1605) Varicella: I  GTT Third trimester: 132 RPR: Non Reactive (07/03 1605)   Rhogam N/A HBsAg: Negative (07/03 1605)   TDaP vaccine 02/26/17 Flu Shot: 01/27/17 HIV: negative  Baby Food                                GBS:   Contraception  Pap: 12/31/2015 NIL  CBB     CS/VBAC    Support Person              History of cesarean section, unknown scar 09/08/2016 by Rod Can, CNM No   Overview Signed 09/08/2016  4:31 PM by Rod Can, CNM    Surgery done in [place]          Preterm labor symptoms and general obstetric precautions including but not limited to vaginal bleeding, contractions, leaking of fluid and fetal movement were reviewed in detail with the patient. Please refer to After Visit Summary for other counseling recommendations.  - [place] next visit S<D weight gain appropriate at 20lbs  Return in about 2 weeks (around 03/12/2017) for ROB  and growth scan .

## 2017-02-26 NOTE — Progress Notes (Signed)
ROB TDAP given today

## 2017-03-12 ENCOUNTER — Encounter: Payer: Self-pay | Admitting: Advanced Practice Midwife

## 2017-03-12 ENCOUNTER — Ambulatory Visit (INDEPENDENT_AMBULATORY_CARE_PROVIDER_SITE_OTHER): Payer: 59 | Admitting: Advanced Practice Midwife

## 2017-03-12 ENCOUNTER — Ambulatory Visit (INDEPENDENT_AMBULATORY_CARE_PROVIDER_SITE_OTHER): Payer: 59

## 2017-03-12 VITALS — BP 106/70 | Wt 130.0 lb

## 2017-03-12 DIAGNOSIS — Z3A30 30 weeks gestation of pregnancy: Secondary | ICD-10-CM

## 2017-03-12 DIAGNOSIS — O26843 Uterine size-date discrepancy, third trimester: Secondary | ICD-10-CM | POA: Diagnosis not present

## 2017-03-12 DIAGNOSIS — O0990 Supervision of high risk pregnancy, unspecified, unspecified trimester: Secondary | ICD-10-CM | POA: Diagnosis not present

## 2017-03-12 DIAGNOSIS — Z3A33 33 weeks gestation of pregnancy: Secondary | ICD-10-CM

## 2017-03-12 NOTE — Progress Notes (Signed)
Routine Prenatal Care Visit  Subjective  Rose Edwards is a 32 y.o. G3P1011 at [redacted]w[redacted]d being seen today for ongoing prenatal care.  She is currently monitored for the following issues for this high-risk pregnancy and has Supervision of high risk pregnancy, antepartum; History of cesarean section, unknown scar; Red blood cell antibody positive; and Uterine fibroid in pregnancy on their problem list.  ----------------------------------------------------------------------------------- Patient reports She has a broken tooth and has used Orajel for pain relief. She is looking for a clinic to schedule an appointment to have the tooth repaired.   Contractions: Not present. Vag. Bleeding: None.  Movement: Present. Denies leaking of fluid.  ----------------------------------------------------------------------------------- The following portions of the patient's history were reviewed and updated as appropriate: allergies, current medications, past family history, past medical history, past social history, past surgical history and problem list. Problem list updated.   Objective  Blood pressure 106/70, weight 130 lb (59 kg), last menstrual period 07/23/2016. Pregravid weight 108 lb (49 kg) Total Weight Gain 22 lb (9.979 kg) Urinalysis: Urine Protein: Negative Urine Glucose: Negative  Fetal Status: Fetal Heart Rate (bpm): 134 Fundal Height: 33 cm Movement: Present   Vertex  Growth scan today: 5#4oz, 62.5%, AFI 7.98 cm  General:  Alert, oriented and cooperative. Patient is in no acute distress.  Skin: Skin is warm and dry. No rash noted.   Cardiovascular: Normal heart rate noted  Respiratory: Normal respiratory effort, no problems with respiration noted  Abdomen: Soft, gravid, appropriate for gestational age. Pain/Pressure: Absent     Pelvic:  Cervical exam deferred        Extremities: Normal range of motion.     Mental Status: Normal mood and affect. Normal behavior. Normal judgment and thought content.    Assessment   32 y.o. G3P1011 at [redacted]w[redacted]d by  04/29/2017, by Last Menstrual Period presenting for routine prenatal visit  Plan   Pregnancy#3 Problems (from 07/23/16 to present)    Problem Noted Resolved   Red blood cell antibody positive 09/22/2016 by Will Bonnet, MD No   Uterine fibroid in pregnancy 09/22/2016 by Will Bonnet, MD No   Supervision of high risk pregnancy, antepartum 09/08/2016 by Rod Can, CNM No   Overview Addendum 02/26/2017 10:09 AM by Malachy Mood, MD    [facility] Prenatal Labs  Dating LMP = 8 week [place] Blood type: B/Positive/-- (07/03 1605)   Genetic Screen Declined Antibody:Positive, See Final Results (08/14 1116)  Anatomic [place] Normal female, 7.5cm fibroid  Left posterior lower uterine segment Rubella: 24.40 (07/03 1605) Varicella: I  GTT Third trimester: 132 RPR: Non Reactive (07/03 1605)   Rhogam N/A HBsAg: Negative (07/03 1605)   TDaP vaccine 02/26/17 Flu Shot: 01/27/17 HIV: negative  Baby Food                                GBS:   Contraception  Pap: 12/31/2015 NIL  CBB     CS/VBAC Repeat sched 04/22/2017   Support Person husband             History of cesarean section, unknown scar 09/08/2016 by Rod Can, CNM No   Overview Signed 09/08/2016  4:31 PM by Rod Can, CNM    Surgery done in [place]          Preterm labor symptoms and general obstetric precautions including but not limited to vaginal bleeding, contractions, leaking of fluid and fetal movement were reviewed in detail with the  patient.   Return in about 2 weeks (around 03/26/2017) for rob.  Rod Can, CNM  03/12/2017 10:14 AM

## 2017-03-12 NOTE — Progress Notes (Signed)
Growth scan today. No vb. No lof.

## 2017-03-26 ENCOUNTER — Ambulatory Visit (INDEPENDENT_AMBULATORY_CARE_PROVIDER_SITE_OTHER): Payer: 59 | Admitting: Obstetrics and Gynecology

## 2017-03-26 VITALS — BP 100/68 | Wt 130.0 lb

## 2017-03-26 DIAGNOSIS — Z3A35 35 weeks gestation of pregnancy: Secondary | ICD-10-CM

## 2017-03-26 DIAGNOSIS — Z98891 History of uterine scar from previous surgery: Secondary | ICD-10-CM

## 2017-03-26 DIAGNOSIS — R768 Other specified abnormal immunological findings in serum: Secondary | ICD-10-CM

## 2017-03-26 DIAGNOSIS — O3410 Maternal care for benign tumor of corpus uteri, unspecified trimester: Secondary | ICD-10-CM

## 2017-03-26 DIAGNOSIS — D259 Leiomyoma of uterus, unspecified: Secondary | ICD-10-CM

## 2017-03-26 DIAGNOSIS — O0990 Supervision of high risk pregnancy, unspecified, unspecified trimester: Secondary | ICD-10-CM

## 2017-03-26 NOTE — Progress Notes (Signed)
ROB

## 2017-03-26 NOTE — Progress Notes (Signed)
    Routine Prenatal Care Visit  Subjective  Rose Edwards is a 32 y.o. G3P1011 at [redacted]w[redacted]d being seen today for ongoing prenatal care.  She is currently monitored for the following issues for this high-risk pregnancy and has Supervision of high risk pregnancy, antepartum; History of cesarean section, unknown scar; Red blood cell antibody positive; and Uterine fibroid in pregnancy on their problem list.  ----------------------------------------------------------------------------------- Patient reports no complaints.   Contractions: Not present. Vag. Bleeding: None.  Movement: Present. Denies leaking of fluid.  ----------------------------------------------------------------------------------- The following portions of the patient's history were reviewed and updated as appropriate: allergies, current medications, past family history, past medical history, past social history, past surgical history and problem list. Problem list updated.   Objective  Blood pressure 100/68, weight 130 lb (59 kg), last menstrual period 07/23/2016. Pregravid weight 108 lb (49 kg) Total Weight Gain 22 lb (9.979 kg) Urinalysis:      Fetal Status: Fetal Heart Rate (bpm): 140 Fundal Height: 33 cm Movement: Present  Presentation: Vertex  General:  Alert, oriented and cooperative. Patient is in no acute distress.  Skin: Skin is warm and dry. No rash noted.   Cardiovascular: Normal heart rate noted  Respiratory: Normal respiratory effort, no problems with respiration noted  Abdomen: Soft, gravid, appropriate for gestational age. Pain/Pressure: Absent     Pelvic:  Cervical exam deferred        Extremities: Normal range of motion.     ental Status: Normal mood and affect. Normal behavior. Normal judgment and thought content.     Assessment   33 y.o. L2G4010 at [redacted]w[redacted]d by  04/29/2017, by Last Menstrual Period presenting for routine prenatal visit  Plan   Pregnancy#3 Problems (from 07/23/16 to present)    Problem Noted  Resolved   Red blood cell antibody positive 09/22/2016 by Will Bonnet, MD No   Uterine fibroid in pregnancy 09/22/2016 by Will Bonnet, MD No   Supervision of high risk pregnancy, antepartum 09/08/2016 by Rod Can, CNM No   Overview Addendum 02/26/2017 10:09 AM by Malachy Mood, MD    Clinic [facility] Prenatal Labs  Dating LMP = 8 week [place] Blood type: B/Positive/-- (07/03 1605)   Genetic Screen Declined Antibody:Positive, See Final Results (08/14 1116)  Anatomic [place] Normal female, 7.5cm fibroid  Left posterior lower uterine segment Rubella: 24.40 (07/03 1605) Varicella: I  GTT Third trimester: 132 RPR: Non Reactive (07/03 1605)   Rhogam N/A HBsAg: Negative (07/03 1605)   TDaP vaccine 02/26/17 Flu Shot: 01/27/17 HIV: negative  Baby Food                                GBS:   Contraception  Pap: 12/31/2015 NIL  CBB     CS/VBAC    Support Person              History of cesarean section, unknown scar 09/08/2016 by Rod Can, CNM No   Overview Signed 09/08/2016  4:31 PM by Rod Can, CNM    Surgery done in [place]          Term labor symptoms and general obstetric precautions including but not limited to vaginal bleeding, contractions, leaking of fluid and fetal movement were reviewed in detail with the patient. Please refer to After Visit Summary for other counseling recommendations.  - GBS next visit  Return in about 1 week (around 04/02/2017) for ROB 1 week, ROB 2 week Rose Edwards.

## 2017-04-02 ENCOUNTER — Ambulatory Visit (INDEPENDENT_AMBULATORY_CARE_PROVIDER_SITE_OTHER): Payer: 59 | Admitting: Obstetrics & Gynecology

## 2017-04-02 ENCOUNTER — Encounter: Payer: 59 | Admitting: Obstetrics & Gynecology

## 2017-04-02 VITALS — BP 100/60 | Wt 131.0 lb

## 2017-04-02 DIAGNOSIS — O0990 Supervision of high risk pregnancy, unspecified, unspecified trimester: Secondary | ICD-10-CM

## 2017-04-02 DIAGNOSIS — Z98891 History of uterine scar from previous surgery: Secondary | ICD-10-CM

## 2017-04-02 DIAGNOSIS — Z3A36 36 weeks gestation of pregnancy: Secondary | ICD-10-CM

## 2017-04-02 LAB — OB RESULTS CONSOLE GBS: STREP GROUP B AG: NEGATIVE

## 2017-04-02 NOTE — Patient Instructions (Signed)
Braxton Hicks Contractions °Contractions of the uterus can occur throughout pregnancy, but they are not always a sign that you are in labor. You may have practice contractions called Braxton Hicks contractions. These false labor contractions are sometimes confused with true labor. °What are Braxton Hicks contractions? °Braxton Hicks contractions are tightening movements that occur in the muscles of the uterus before labor. Unlike true labor contractions, these contractions do not result in opening (dilation) and thinning of the cervix. Toward the end of pregnancy (32-34 weeks), Braxton Hicks contractions can happen more often and may become stronger. These contractions are sometimes difficult to tell apart from true labor because they can be very uncomfortable. You should not feel embarrassed if you go to the hospital with false labor. °Sometimes, the only way to tell if you are in true labor is for your health care provider to look for changes in the cervix. The health care provider will do a physical exam and may monitor your contractions. If you are not in true labor, the exam should show that your cervix is not dilating and your water has not broken. °If there are other health problems associated with your pregnancy, it is completely safe for you to be sent home with false labor. You may continue to have Braxton Hicks contractions until you go into true labor. °How to tell the difference between true labor and false labor °True labor °· Contractions last 30-70 seconds. °· Contractions become very regular. °· Discomfort is usually felt in the top of the uterus, and it spreads to the lower abdomen and low back. °· Contractions do not go away with walking. °· Contractions usually become more intense and increase in frequency. °· The cervix dilates and gets thinner. °False labor °· Contractions are usually shorter and not as strong as true labor contractions. °· Contractions are usually irregular. °· Contractions  are often felt in the front of the lower abdomen and in the groin. °· Contractions may go away when you walk around or change positions while lying down. °· Contractions get weaker and are shorter-lasting as time goes on. °· The cervix usually does not dilate or become thin. °Follow these instructions at home: °· Take over-the-counter and prescription medicines only as told by your health care provider. °· Keep up with your usual exercises and follow other instructions from your health care provider. °· Eat and drink lightly if you think you are going into labor. °· If Braxton Hicks contractions are making you uncomfortable: °? Change your position from lying down or resting to walking, or change from walking to resting. °? Sit and rest in a tub of warm water. °? Drink enough fluid to keep your urine pale yellow. Dehydration may cause these contractions. °? Do slow and deep breathing several times an hour. °· Keep all follow-up prenatal visits as told by your health care provider. This is important. °Contact a health care provider if: °· You have a fever. °· You have continuous pain in your abdomen. °Get help right away if: °· Your contractions become stronger, more regular, and closer together. °· You have fluid leaking or gushing from your vagina. °· You pass blood-tinged mucus (bloody show). °· You have bleeding from your vagina. °· You have low back pain that you never had before. °· You feel your baby’s head pushing down and causing pelvic pressure. °· Your baby is not moving inside you as much as it used to. °Summary °· Contractions that occur before labor are called Braxton   Hicks contractions, false labor, or practice contractions. °· Braxton Hicks contractions are usually shorter, weaker, farther apart, and less regular than true labor contractions. True labor contractions usually become progressively stronger and regular and they become more frequent. °· Manage discomfort from Braxton Hicks contractions by  changing position, resting in a warm bath, drinking plenty of water, or practicing deep breathing. °This information is not intended to replace advice given to you by your health care provider. Make sure you discuss any questions you have with your health care provider. °Document Released: 07/09/2016 Document Revised: 07/09/2016 Document Reviewed: 07/09/2016 °Elsevier Interactive Patient Education © 2018 [employer] ° °

## 2017-04-02 NOTE — Progress Notes (Signed)
  Subjective  Fetal Movement? yes Contractions? no Leaking Fluid? no Vaginal Bleeding? no  Objective  BP 100/60   Wt 131 lb (59.4 kg)   LMP 07/23/2016   BMI 24.75 kg/m  General: NAD Pumonary: no increased work of breathing Abdomen: gravid, non-tender Extremities: no edema Psychiatric: mood appropriate, affect full  Assessment  32 y.o. G3P1011 at [redacted]w[redacted]d by  04/29/2017, by Last Menstrual Period presenting for routine prenatal visit  Plan   Problem List Items Addressed This Visit      Other   Supervision of high risk pregnancy, antepartum   History of cesarean section, unknown scar    Other Visit Diagnoses    [redacted] weeks gestation of pregnancy    -  Primary   Relevant Orders   Culture, beta strep (group b only)    Labor precautions discussed. Plans CS Feb 14  Barnett Applebaum, MD, [facility], [facility] 04/02/2017  4:01 PM

## 2017-04-06 LAB — CULTURE, BETA STREP (GROUP B ONLY): Strep Gp B Culture: NEGATIVE

## 2017-04-08 ENCOUNTER — Ambulatory Visit (INDEPENDENT_AMBULATORY_CARE_PROVIDER_SITE_OTHER): Payer: 59 | Admitting: Obstetrics and Gynecology

## 2017-04-08 VITALS — BP 98/64 | Wt 133.0 lb

## 2017-04-08 DIAGNOSIS — O3410 Maternal care for benign tumor of corpus uteri, unspecified trimester: Secondary | ICD-10-CM

## 2017-04-08 DIAGNOSIS — R768 Other specified abnormal immunological findings in serum: Secondary | ICD-10-CM

## 2017-04-08 DIAGNOSIS — Z98891 History of uterine scar from previous surgery: Secondary | ICD-10-CM

## 2017-04-08 DIAGNOSIS — O0990 Supervision of high risk pregnancy, unspecified, unspecified trimester: Secondary | ICD-10-CM

## 2017-04-08 DIAGNOSIS — D259 Leiomyoma of uterus, unspecified: Secondary | ICD-10-CM

## 2017-04-08 DIAGNOSIS — Z3A37 37 weeks gestation of pregnancy: Secondary | ICD-10-CM

## 2017-04-08 NOTE — Progress Notes (Signed)
ROB B/P recheck per AMS 102/60

## 2017-04-14 ENCOUNTER — Ambulatory Visit (INDEPENDENT_AMBULATORY_CARE_PROVIDER_SITE_OTHER): Payer: 59 | Admitting: Obstetrics and Gynecology

## 2017-04-14 VITALS — BP 102/60 | Wt 132.0 lb

## 2017-04-14 DIAGNOSIS — O0990 Supervision of high risk pregnancy, unspecified, unspecified trimester: Secondary | ICD-10-CM

## 2017-04-14 DIAGNOSIS — Z98891 History of uterine scar from previous surgery: Secondary | ICD-10-CM

## 2017-04-14 DIAGNOSIS — R768 Other specified abnormal immunological findings in serum: Secondary | ICD-10-CM

## 2017-04-14 DIAGNOSIS — Z3A37 37 weeks gestation of pregnancy: Secondary | ICD-10-CM

## 2017-04-14 NOTE — Progress Notes (Signed)
Rob

## 2017-04-14 NOTE — Progress Notes (Signed)
    Routine Prenatal Care Visit  Subjective  Rose Edwards is a 32 y.o. G3P1011 at [redacted]w[redacted]d being seen today for ongoing prenatal care.  She is currently monitored for the following issues for this low-risk pregnancy and has Supervision of high risk pregnancy, antepartum; History of cesarean section, unknown scar; Red blood cell antibody positive; and Uterine fibroid in pregnancy on their problem list.  ----------------------------------------------------------------------------------- Patient reports no complaints.   Contractions: Irregular. Vag. Bleeding: None.  Movement: Present. Denies leaking of fluid.  ----------------------------------------------------------------------------------- The following portions of the patient's history were reviewed and updated as appropriate: allergies, current medications, past family history, past medical history, past social history, past surgical history and problem list. Problem list updated.   Objective  Blood pressure 102/60, weight 132 lb (59.9 kg), last menstrual period 07/23/2016. Pregravid weight 108 lb (49 kg) Total Weight Gain 24 lb (10.9 kg) Urinalysis:      Fetal Status: Fetal Heart Rate (bpm): 130 Fundal Height: 35 cm Movement: Present  Presentation: Vertex  General:  Alert, oriented and cooperative. Patient is in no acute distress.  Skin: Skin is warm and dry. No rash noted.   Cardiovascular: Normal heart rate noted  Respiratory: Normal respiratory effort, no problems with respiration noted  Abdomen: Soft, gravid, appropriate for gestational age. Pain/Pressure: Absent     Pelvic:  Cervical exam deferred        Extremities: Normal range of motion.     ental Status: Normal mood and affect. Normal behavior. Normal judgment and thought content.     Assessment   32 y.o. O1H0865 at [redacted]w[redacted]d by  04/29/2017, by Last Menstrual Period presenting for routine prenatal visit  Plan   Pregnancy#3 Problems (from 07/23/16 to present)    Problem Noted  Resolved   Red blood cell antibody positive 09/22/2016 by Will Bonnet, MD No   Uterine fibroid in pregnancy 09/22/2016 by Will Bonnet, MD No   Supervision of high risk pregnancy, antepartum 09/08/2016 by Rod Can, CNM No   Overview Addendum 02/26/2017 10:09 AM by Malachy Mood, MD    Clinic [facility] Prenatal Labs  Dating LMP = 8 week [place] Blood type: B/Positive/-- (07/03 1605)   Genetic Screen Declined Antibody:Positive, See Final Results (08/14 1116)  Anatomic [place] Normal female, 7.5cm fibroid  Left posterior lower uterine segment Rubella: 24.40 (07/03 1605) Varicella: I  GTT Third trimester: 132 RPR: Non Reactive (07/03 1605)   Rhogam N/A HBsAg: Negative (07/03 1605)   TDaP vaccine 02/26/17 Flu Shot: 01/27/17 HIV: negative  Baby Food                                GBS:   Contraception  Pap: 12/31/2015 NIL  CBB     CS/VBAC    Support Person              History of cesarean section, unknown scar 09/08/2016 by Rod Can, CNM No   Overview Signed 09/08/2016  4:31 PM by Rod Can, CNM    Surgery done in [place]          Term labor symptoms and general obstetric precautions including but not limited to vaginal bleeding, contractions, leaking of fluid and fetal movement were reviewed in detail with the patient. Please refer to After Visit Summary for other counseling recommendations.   Return in about 1 week (around 04/21/2017) for [place].

## 2017-04-15 ENCOUNTER — Telehealth: Payer: Self-pay

## 2017-04-15 NOTE — Telephone Encounter (Signed)
FMLA/DISABILITY form for pt's [employer] for FMLA Source filled out, signature obtained, and given to [place] for processing.

## 2017-04-16 ENCOUNTER — Inpatient Hospital Stay
Admission: EM | Admit: 2017-04-16 | Discharge: 2017-04-18 | DRG: 788 | Disposition: A | Discharge status: Home / Self Care | Payer: 59 | Attending: Obstetrics and Gynecology | Admitting: Obstetrics and Gynecology

## 2017-04-16 ENCOUNTER — Other Ambulatory Visit: Payer: Self-pay

## 2017-04-16 ENCOUNTER — Encounter: Payer: Self-pay | Admitting: Obstetrics and Gynecology

## 2017-04-16 ENCOUNTER — Observation Stay
Admission: EM | Admit: 2017-04-16 | Discharge: 2017-04-16 | Disposition: A | Discharge status: Home / Self Care | Payer: 59 | Source: Home / Self Care | Admitting: Obstetrics and Gynecology

## 2017-04-16 DIAGNOSIS — Z3A38 38 weeks gestation of pregnancy: Secondary | ICD-10-CM | POA: Insufficient documentation

## 2017-04-16 DIAGNOSIS — O0990 Supervision of high risk pregnancy, unspecified, unspecified trimester: Secondary | ICD-10-CM

## 2017-04-16 DIAGNOSIS — O3410 Maternal care for benign tumor of corpus uteri, unspecified trimester: Secondary | ICD-10-CM

## 2017-04-16 DIAGNOSIS — O34211 Maternal care for low transverse scar from previous cesarean delivery: Principal | ICD-10-CM | POA: Diagnosis present

## 2017-04-16 DIAGNOSIS — R768 Other specified abnormal immunological findings in serum: Secondary | ICD-10-CM

## 2017-04-16 DIAGNOSIS — J040 Acute laryngitis: Secondary | ICD-10-CM | POA: Insufficient documentation

## 2017-04-16 DIAGNOSIS — O471 False labor at or after 37 completed weeks of gestation: Secondary | ICD-10-CM | POA: Insufficient documentation

## 2017-04-16 DIAGNOSIS — O3413 Maternal care for benign tumor of corpus uteri, third trimester: Secondary | ICD-10-CM | POA: Diagnosis present

## 2017-04-16 DIAGNOSIS — O26893 Other specified pregnancy related conditions, third trimester: Secondary | ICD-10-CM | POA: Insufficient documentation

## 2017-04-16 DIAGNOSIS — Z98891 History of uterine scar from previous surgery: Secondary | ICD-10-CM

## 2017-04-16 DIAGNOSIS — D259 Leiomyoma of uterus, unspecified: Secondary | ICD-10-CM

## 2017-04-16 LAB — INFLUENZA PANEL BY PCR (TYPE A & B)
INFLAPCR: NEGATIVE
INFLBPCR: NEGATIVE

## 2017-04-16 MED ORDER — ACETAMINOPHEN 325 MG PO TABS: 650.0000 mg | ORAL_TABLET | ORAL | Status: DC | PRN

## 2017-04-16 NOTE — OB Triage Note (Signed)
Pt. Came into ED with complaints of having contractions that started yesterday morning at approximately 0300. Pt. States that she was laying down. Denies having sexual intercourse recently. Pt. States that contraction pain is about a 6 out of 10 and irregular. Pt. States that walking helps relieve the contraction pain. Denies having any bleeding or abnormal discharge.   Pt. Presents with a hoarse voice, with chest tightness and SOB, with a cough as well. States that the chest tightness is constant.

## 2017-04-16 NOTE — Discharge Summary (Signed)
See final progress note. 

## 2017-04-16 NOTE — Final Progress Note (Signed)
Physician Final Progress Note  Patient ID: Rose Edwards MRN: [id number] DOB/AGE: [birth year] 32 y.o.  Admit date: 04/16/2017 Admitting provider: Will Bonnet, MD Discharge date: 04/16/2017   Admission Diagnoses:  1) intrauterine pregnancy at [redacted]w[redacted]d  2) abdominal pain, concern for labor 3) sore throat, cough  Discharge Diagnoses:  1) intrauterine pregnancy at [redacted]w[redacted]d  2) abdominal pain, false labor 3) sore throat, cough - laryngitis  History of Present Illness: The patient is a 32 y.o. female G3P1011 at [redacted]w[redacted]d who presents for contractions since yesterday morning at 3 am. They woke her from sleep and she has had intermittent contractions since that time, every 10 minutes or so. She notes +FM, no LOF, no VB.  She also states that she is losing her voice and has a mild cough. She has no sick contacts, denies fever. She states that she has had some trouble breathing, but this has been consistent her entire pregnancy.     Past Medical History: denies   Past Surgical History:  Procedure Laterality Date  . CESAREAN SECTION  2012   [place]    No current facility-administered medications on file prior to encounter.    Current Outpatient Medications on File Prior to Encounter  Medication Sig Dispense Refill  . Prenatal Vit-Fe Fumarate-FA (PRENATAL PO) Take 1 tablet by mouth daily.    Marland Kitchen acetaminophen (TYLENOL) 325 MG tablet Take 650 mg by mouth daily as needed for moderate pain or headache.     Allergies: No Known Allergies  Social History   Socioeconomic History  . Marital status: Married    Spouse name: Not on file  . Number of children: Not on file  . Years of education: Not on file  . Highest education level: Not on file  Social Needs  . Financial resource strain: Not on file  . Food insecurity - worry: Not on file  . Food insecurity - inability: Not on file  . Transportation needs - medical: Not on file  . Transportation needs - non-medical: Not on file  Occupational History   . Not on file  Tobacco Use  . Smoking status: Never Smoker  . Smokeless tobacco: Never Used  Substance and Sexual Activity  . Alcohol use: No  . Drug use: No  . Sexual activity: Yes    Birth control/protection: None  Other Topics Concern  . Not on file  Social History Narrative  . Not on file   Family History  Problem Relation Age of Onset  . Hypertension Mother      Physical Exam: BP 112/75   Pulse (!) 120   Temp 98.2 F (36.8 C) (Oral)   Ht 5\' 1"  (1.549 m)   Wt 133 lb (60.3 kg)   LMP 07/23/2016   SpO2 99%   BMI 25.13 kg/m   Physical Exam  Constitutional: She is oriented to person, place, and time. She appears well-developed and well-nourished. No distress.  HENT:  Head: Normocephalic and atraumatic.  Her voice sounds hoarse  Eyes: Conjunctivae are normal. No scleral icterus.  Neck: Normal range of motion. Neck supple.  Cardiovascular: Normal rate, regular rhythm and normal heart sounds. Exam reveals no gallop and no friction rub.  No murmur heard. Pulmonary/Chest: Effort normal and breath sounds normal. No stridor. No respiratory distress. She has no wheezes. She has no rales. She exhibits no tenderness.  Abdominal: Soft. She exhibits no distension. There is no tenderness. There is no guarding. Hernia confirmed negative in the right inguinal area and confirmed negative  in the left inguinal area.  Gravid, NT  Genitourinary: Pelvic exam was performed with patient supine. There is no rash, tenderness or lesion on the right labia. There is no rash, tenderness or lesion on the left labia.  Genitourinary Comments: Cervix: close/50%/high  Musculoskeletal: Normal range of motion.  Lymphadenopathy:       Right: No inguinal adenopathy present.       Left: No inguinal adenopathy present.  Neurological: She is alert and oriented to person, place, and time.  Skin: Skin is warm and dry. No rash noted.  Psychiatric: She has a normal mood and affect. Her behavior is normal.  Judgment normal.   Female chaperone present for pelvic exam:   Consults: None  Significant Findings/ Diagnostic Studies:  Rapid flu test: negative  Procedures: NST Baseline FHR: 135 beats/min Variability: moderate Accelerations: present Decelerations: absent Tocometry: 3 -4 q 10 minutes (mild - patient does not notice them)  Interpretation:  INDICATIONS: rule out uterine contractions RESULTS:  A NST procedure was performed with FHR monitoring and a normal baseline established, appropriate time of 20-40 minutes of evaluation, and accels >2 seen w 15x15 characteristics.  Results show a REACTIVE NST.    Discharge Condition: stable  Disposition: 01-Home or Self Care  Diet: Regular diet  Discharge Activity: Activity as tolerated   Allergies as of 04/16/2017   No Known Allergies     Medication List    TAKE these medications   acetaminophen 325 MG tablet Commonly known as:  TYLENOL Take 650 mg by mouth daily as needed for moderate pain or headache.   PRENATAL PO Take 1 tablet by mouth daily.        Total time spent taking care of this patient: 30 minutes  Signed: Prentice Docker, MD  04/16/2017, 8:51 PM

## 2017-04-17 ENCOUNTER — Inpatient Hospital Stay: Payer: 59 | Admitting: Anesthesiology

## 2017-04-17 ENCOUNTER — Other Ambulatory Visit: Payer: Self-pay

## 2017-04-17 ENCOUNTER — Encounter
Admission: EM | Disposition: A | Discharge status: Home / Self Care | Payer: Self-pay | Source: Home / Self Care | Attending: Obstetrics and Gynecology

## 2017-04-17 ENCOUNTER — Inpatient Hospital Stay: Payer: 59 | Admitting: Registered Nurse

## 2017-04-17 DIAGNOSIS — O3413 Maternal care for benign tumor of corpus uteri, third trimester: Secondary | ICD-10-CM | POA: Diagnosis present

## 2017-04-17 DIAGNOSIS — O34211 Maternal care for low transverse scar from previous cesarean delivery: Secondary | ICD-10-CM | POA: Diagnosis present

## 2017-04-17 DIAGNOSIS — Z3A38 38 weeks gestation of pregnancy: Secondary | ICD-10-CM

## 2017-04-17 DIAGNOSIS — D259 Leiomyoma of uterus, unspecified: Secondary | ICD-10-CM | POA: Diagnosis present

## 2017-04-17 LAB — RAPID HIV SCREEN (HIV 1/2 AB+AG)
HIV 1/2 Antibodies: NONREACTIVE
HIV-1 P24 Antigen - HIV24: NONREACTIVE

## 2017-04-17 LAB — CBC
HCT: 36.2 % (ref 35.0–47.0)
HEMATOCRIT: 33.6 % — AB (ref 35.0–47.0)
HEMOGLOBIN: 11.4 g/dL — AB (ref 12.0–16.0)
Hemoglobin: 12.1 g/dL (ref 12.0–16.0)
MCH: 25.9 pg — AB (ref 26.0–34.0)
MCH: 26.1 pg (ref 26.0–34.0)
MCHC: 33.4 g/dL (ref 32.0–36.0)
MCHC: 33.8 g/dL (ref 32.0–36.0)
MCV: 77.1 fL — ABNORMAL LOW (ref 80.0–100.0)
MCV: 77.6 fL — AB (ref 80.0–100.0)
PLATELETS: 165 10*3/uL (ref 150–440)
Platelets: 141 10*3/uL — ABNORMAL LOW (ref 150–440)
RBC: 4.67 MIL/uL (ref 3.80–5.20)
RBC: 4.36 MIL/uL (ref 3.80–5.20)
RDW: 15.8 % — AB (ref 11.5–14.5)
RDW: 16.2 % — ABNORMAL HIGH (ref 11.5–14.5)
WBC: 7.5 10*3/uL (ref 3.6–11.0)
WBC: 7.9 10*3/uL (ref 3.6–11.0)

## 2017-04-17 LAB — TYPE AND SCREEN
ABO/RH(D): B POS
Antibody Screen: NEGATIVE

## 2017-04-17 SURGERY — Surgical Case
Anesthesia: Spinal

## 2017-04-17 MED ORDER — DIBUCAINE 1 % RE OINT: 1.0000 "application " | TOPICAL_OINTMENT | RECTAL | Status: DC | PRN

## 2017-04-17 MED ORDER — LACTATED RINGERS IV SOLN
INTRAVENOUS | Status: DC
  Administered 2017-04-17: 14:00:00 via INTRAVENOUS

## 2017-04-17 MED ORDER — MORPHINE SULFATE (PF) 0.5 MG/ML IJ SOLN
INTRAMUSCULAR | Status: AC
  Filled 2017-04-17: qty 10

## 2017-04-17 MED ORDER — SOD CITRATE-CITRIC ACID 500-334 MG/5ML PO SOLN
30.0000 mL | ORAL | Status: AC
  Administered 2017-04-17: 30 mL via ORAL
  Filled 2017-04-17 (×2): qty 15

## 2017-04-17 MED ORDER — LACTATED RINGERS IV SOLN
INTRAVENOUS | Status: DC | PRN
  Administered 2017-04-17: 01:00:00 via INTRAVENOUS

## 2017-04-17 MED ORDER — BUPIVACAINE 0.25 % ON-Q PUMP DUAL CATH 400 ML
400.0000 mL | INJECTION | Status: DC
  Filled 2017-04-17: qty 400

## 2017-04-17 MED ORDER — OXYTOCIN 40 UNITS IN LACTATED RINGERS INFUSION - SIMPLE MED: 2.5000 [IU]/h | INTRAVENOUS | Status: AC

## 2017-04-17 MED ORDER — OXYCODONE-ACETAMINOPHEN 5-325 MG PO TABS
2.0000 | ORAL_TABLET | ORAL | Status: DC | PRN
Start: 2017-04-18 — End: 2017-04-18

## 2017-04-17 MED ORDER — WITCH HAZEL-GLYCERIN EX PADS: 1.0000 "application " | MEDICATED_PAD | CUTANEOUS | Status: DC | PRN

## 2017-04-17 MED ORDER — OXYCODONE-ACETAMINOPHEN 5-325 MG PO TABS
1.0000 | ORAL_TABLET | ORAL | Status: DC | PRN
Start: 2017-04-18 — End: 2017-04-18

## 2017-04-17 MED ORDER — COCONUT OIL OIL
1.0000 "application " | TOPICAL_OIL | Status: DC | PRN
  Administered 2017-04-17: 1 via TOPICAL
  Filled 2017-04-17: qty 120

## 2017-04-17 MED ORDER — MENTHOL 3 MG MT LOZG
1.0000 | LOZENGE | OROMUCOSAL | Status: DC | PRN
  Administered 2017-04-17: 3 mg via ORAL
  Filled 2017-04-17 (×2): qty 9

## 2017-04-17 MED ORDER — OXYTOCIN 40 UNITS IN LACTATED RINGERS INFUSION - SIMPLE MED
INTRAVENOUS | Status: DC | PRN
  Administered 2017-04-17: 1000 mL via INTRAVENOUS

## 2017-04-17 MED ORDER — BUPIVACAINE IN DEXTROSE 0.75-8.25 % IT SOLN
INTRATHECAL | Status: DC | PRN
  Administered 2017-04-17: 1.7 mL via INTRATHECAL

## 2017-04-17 MED ORDER — IBUPROFEN 600 MG PO TABS
600.0000 mg | ORAL_TABLET | Freq: Four times a day (QID) | ORAL | Status: DC
  Administered 2017-04-17 – 2017-04-18 (×3): 600 mg via ORAL
  Filled 2017-04-17 (×3): qty 1

## 2017-04-17 MED ORDER — FERROUS SULFATE 325 (65 FE) MG PO TABS
325.0000 mg | ORAL_TABLET | Freq: Two times a day (BID) | ORAL | Status: DC
  Administered 2017-04-17 – 2017-04-18 (×3): 325 mg via ORAL
  Filled 2017-04-17 (×3): qty 1

## 2017-04-17 MED ORDER — ONDANSETRON HCL 4 MG/2ML IJ SOLN
INTRAMUSCULAR | Status: AC
  Filled 2017-04-17: qty 2

## 2017-04-17 MED ORDER — BUPIVACAINE HCL (PF) 0.5 % IJ SOLN
5.0000 mL | Freq: Once | INTRAMUSCULAR | Status: DC
  Filled 2017-04-17: qty 30

## 2017-04-17 MED ORDER — SODIUM CHLORIDE 0.9 % IV SOLN
INTRAVENOUS | Status: DC | PRN
  Administered 2017-04-17: 20 ug/min via INTRAVENOUS

## 2017-04-17 MED ORDER — SENNOSIDES-DOCUSATE SODIUM 8.6-50 MG PO TABS
2.0000 | ORAL_TABLET | ORAL | Status: DC
  Administered 2017-04-17: 2 via ORAL
  Filled 2017-04-17: qty 2

## 2017-04-17 MED ORDER — DIPHENHYDRAMINE HCL 25 MG PO CAPS: 25.0000 mg | ORAL_CAPSULE | Freq: Four times a day (QID) | ORAL | Status: DC | PRN

## 2017-04-17 MED ORDER — CEFAZOLIN SODIUM-DEXTROSE 2-4 GM/100ML-% IV SOLN
2.0000 g | INTRAVENOUS | Status: AC
  Administered 2017-04-17: 2 g via INTRAVENOUS
  Filled 2017-04-17: qty 100

## 2017-04-17 MED ORDER — PRENATAL MULTIVITAMIN CH
1.0000 | ORAL_TABLET | Freq: Every day | ORAL | Status: DC
Start: 2017-04-17 — End: 2017-04-18
  Administered 2017-04-17: 1 via ORAL
  Filled 2017-04-17: qty 1

## 2017-04-17 MED ORDER — PHENYLEPHRINE 40 MCG/ML (10ML) SYRINGE FOR IV PUSH (FOR BLOOD PRESSURE SUPPORT)
PREFILLED_SYRINGE | INTRAVENOUS | Status: DC | PRN
  Administered 2017-04-17 (×3): 100 ug via INTRAVENOUS

## 2017-04-17 MED ORDER — MORPHINE SULFATE (PF) 0.5 MG/ML IJ SOLN
INTRAMUSCULAR | Status: DC | PRN
  Administered 2017-04-17: .1 mg via EPIDURAL

## 2017-04-17 MED ORDER — MORPHINE SULFATE (PF) 2 MG/ML IV SOLN: 1.0000 mg | INTRAVENOUS | Status: AC | PRN

## 2017-04-17 MED ORDER — ONDANSETRON HCL 4 MG/2ML IJ SOLN
INTRAMUSCULAR | Status: DC | PRN
  Administered 2017-04-17: 4 mg via INTRAVENOUS

## 2017-04-17 MED ORDER — ONDANSETRON HCL 4 MG/2ML IJ SOLN: 4.0000 mg | Freq: Once | INTRAMUSCULAR | Status: DC | PRN

## 2017-04-17 MED ORDER — OXYTOCIN 40 UNITS IN LACTATED RINGERS INFUSION - SIMPLE MED
INTRAVENOUS | Status: AC
  Filled 2017-04-17: qty 1000

## 2017-04-17 MED ORDER — IBUPROFEN 600 MG PO TABS: 600.0000 mg | ORAL_TABLET | Freq: Four times a day (QID) | ORAL | Status: DC

## 2017-04-17 MED ORDER — BUPIVACAINE HCL 0.5 % IJ SOLN
INTRAMUSCULAR | Status: DC | PRN
  Administered 2017-04-17: 10 mL

## 2017-04-17 MED ORDER — FENTANYL CITRATE (PF) 100 MCG/2ML IJ SOLN
25.0000 ug | INTRAMUSCULAR | Status: DC | PRN
  Administered 2017-04-17 (×4): 25 ug via INTRAVENOUS
  Filled 2017-04-17: qty 2

## 2017-04-17 MED ORDER — SIMETHICONE 80 MG PO CHEW
80.0000 mg | CHEWABLE_TABLET | Freq: Three times a day (TID) | ORAL | Status: DC
  Administered 2017-04-17 – 2017-04-18 (×4): 80 mg via ORAL
  Filled 2017-04-17 (×4): qty 1

## 2017-04-17 MED ORDER — ACETAMINOPHEN 325 MG PO TABS: 650.0000 mg | ORAL_TABLET | Freq: Four times a day (QID) | ORAL | Status: DC | PRN

## 2017-04-17 SURGICAL SUPPLY — 31 items
CANISTER SUCT 3000ML PPV (MISCELLANEOUS) ×2 IMPLANT
CATH KIT ON-Q SILVERSOAK 5IN (CATHETERS) ×4 IMPLANT
DERMABOND ADVANCED (GAUZE/BANDAGES/DRESSINGS) ×1
DERMABOND ADVANCED .7 DNX12 (GAUZE/BANDAGES/DRESSINGS) ×1 IMPLANT
DRSG OPSITE POSTOP 4X10 (GAUZE/BANDAGES/DRESSINGS) ×2 IMPLANT
DRSG TELFA 3X8 NADH (GAUZE/BANDAGES/DRESSINGS) IMPLANT
ELECT CAUTERY BLADE 6.4 (BLADE) ×2 IMPLANT
ELECT REM PT RETURN 9FT ADLT (ELECTROSURGICAL) ×2
ELECTRODE REM PT RTRN 9FT ADLT (ELECTROSURGICAL) ×1 IMPLANT
GAUZE SPONGE 4X4 12PLY STRL (GAUZE/BANDAGES/DRESSINGS) IMPLANT
GLOVE BIO SURGEON STRL SZ7 (GLOVE) ×6 IMPLANT
GLOVE INDICATOR 7.5 STRL GRN (GLOVE) ×6 IMPLANT
GOWN STRL REUS W/ TWL LRG LVL3 (GOWN DISPOSABLE) ×3 IMPLANT
GOWN STRL REUS W/TWL LRG LVL3 (GOWN DISPOSABLE) ×3
NS IRRIG 1000ML POUR BTL (IV SOLUTION) ×2 IMPLANT
PACK C SECTION AR (MISCELLANEOUS) ×2 IMPLANT
PAD OB MATERNITY 4.3X12.25 (PERSONAL CARE ITEMS) ×4 IMPLANT
PAD PREP 24X41 OB/GYN DISP (PERSONAL CARE ITEMS) ×2 IMPLANT
SPONGE LAP 18X18 5 PK (GAUZE/BANDAGES/DRESSINGS) ×2 IMPLANT
STRIP CLOSURE SKIN 1/2X4 (GAUZE/BANDAGES/DRESSINGS) IMPLANT
SUT CHROMIC GUT BROWN 0 54 (SUTURE) ×1 IMPLANT
SUT CHROMIC GUT BROWN 0 54IN (SUTURE) ×2
SUT MNCRL 4-0 (SUTURE) ×1
SUT MNCRL 4-0 27XMFL (SUTURE) ×1
SUT PDS AB 1 TP1 96 (SUTURE) ×2 IMPLANT
SUT PLAIN GUT 0 (SUTURE) IMPLANT
SUT VIC AB 0 CTX 36 (SUTURE) ×2
SUT VIC AB 0 CTX36XBRD ANBCTRL (SUTURE) ×2 IMPLANT
SUT VICRYL 3-0 CR8 SH (SUTURE) ×2 IMPLANT
SUTURE MNCRL 4-0 27XMF (SUTURE) ×1 IMPLANT
SWABSTK COMLB BENZOIN TINCTURE (MISCELLANEOUS) IMPLANT

## 2017-04-17 NOTE — OB Triage Note (Signed)
Patient came in for observation for labor evaluation. Patient reports contractions every two minutes for the last hour. Patient rates pain 8/10. Patient denies leaking of fluid, denies vaginal bleeding and spotting. Vital signs stable and patient afebrile. Patient reports + FM. FHR baseline 135 with moderate variability with accelerations 15 x 15 and no decelerations. Significant other at bedside. Will continue to monitor.

## 2017-04-17 NOTE — H&P (Signed)
OB History & Physical   History of Present Illness:  Chief Complaint: contraction  HPI:  Rose Edwards is a 32 y.o. G21P1011 female at [redacted]w[redacted]d dated by LMP consistent with 8 week ultrasound.  Her pregnancy has been complicated by history of prior cesarean section (unknown uterine scar), antibody screen positive (unidentified), 7.5 cm fibroid in posterior lower uterine segment of uterus.    She reports contractions.   She denies leakage of fluid.   She denies vaginal bleeding.   She reports fetal movement.    Maternal Medical History:  History reviewed. No pertinent past medical history.  Past Surgical History:  Procedure Laterality Date  . CESAREAN SECTION  2012   [place]    No Known Allergies  Prior to Admission medications   Medication Sig Start Date End Date Taking? Authorizing Provider  acetaminophen (TYLENOL) 325 MG tablet Take 650 mg by mouth daily as needed for moderate pain or headache.    [provider]  Prenatal Vit-Fe Fumarate-FA (PRENATAL PO) Take 1 tablet by mouth daily.    [provider]    OB History  Gravida Para Term Preterm AB Living  3 1 1   1 1   SAB TAB Ectopic Multiple Live Births  1       1    # Outcome Date GA Lbr Len/2nd Weight Sex Delivery Anes PTL Lv  3 Current           2 SAB 02/01/16          1 Term 03/17/10   6 lb 9 oz (2.977 kg) F CS-LTranv  N LIV     Complications: Fetal Intolerance      Prenatal care site: [facility]  Social History: She  reports that  has never smoked. she has never used smokeless tobacco. She reports that she does not drink alcohol or use drugs.  Family History: family history includes Hypertension in her mother.   Review of Systems  Constitutional: Negative.   HENT: Negative.   Eyes: Negative.   Respiratory: Negative.   Cardiovascular: Negative.   Gastrointestinal: Positive for abdominal pain. Negative for blood in stool, constipation, diarrhea, heartburn, melena, nausea and vomiting.    Genitourinary: Negative.   Musculoskeletal: Negative.   Skin: Negative.   Neurological: Negative.   Psychiatric/Behavioral: Negative.      Physical Exam:  Vital Signs: BP 107/78 (BP Location: Left Arm)   Pulse (!) 113   Temp 97.8 F (36.6 C) (Oral)   Resp 19   Ht 5\' 1"  (1.549 m)   Wt 133 lb (60.3 kg)   LMP 07/23/2016   BMI 25.13 kg/m  Constitutional: Well nourished, well developed female in moderate distress.  HEENT: normal Skin: Warm and dry.  Cardiovascular: Regular rate and rhythm.   Extremity: no edema  Respiratory: Clear to auscultation bilateral. Normal respiratory effort Abdomen: FHT present and gravid, tender with contractions Back: no CVAT Neuro: DTRs 2+, Cranial nerves grossly intact Psych: Alert and Oriented x3. No memory deficits. Normal mood and affect.  MS: normal gait, normal bilateral lower extremity ROM/strength/stability.  Pelvic exam:  Cervix: 3.5 cm (previously closed several hours ago)  Pertinent Results:  Prenatal Labs: Blood type/Rh B positive  Antibody screen negative  Rubella Immune  Varicella Immune    RPR NR  HBsAg negative  HIV negative  GC negative  Chlamydia negative  Genetic screening Declined  1 hour GTT 132  3 hour GTT n/a  GBS negative on 04/02/2017   Baseline FHR:  135 beats/min   Variability: moderate   Accelerations: present   Decelerations: absent Contractions: present frequency: 3 q 10 min Overall assessment: cat 1  Assessment:  Rose Edwards is a 88 y.o. G42P1011 female at [redacted]w[redacted]d with active labor, history of cesarean delivery. History of positive antibody screen this pregnancy, however negative most recently on 02/12/17  Plan:  1. Admit to Labor & Delivery  2. CBC, T&S, NPO, IVF 3. GBS negative.   4. [place] well-being: reassuring, category 1 5. To OR for repeat cesarean section per patient preference. I have reviewed the risks and benefits of the surgery personally with the patient and her husband.   Rose Docker,  MD 04/17/2017 12:47 AM

## 2017-04-17 NOTE — Anesthesia Preprocedure Evaluation (Signed)
Anesthesia Evaluation  Patient identified by MRN, date of birth, ID band Patient awake    Reviewed: Allergy & Precautions, NPO status , Patient's Chart, lab work & pertinent test results  Airway Mallampati: II  TM Distance: >3 FB     Dental  (+) Teeth Intact   Pulmonary neg pulmonary ROS,    Pulmonary exam normal        Cardiovascular negative cardio ROS Normal cardiovascular exam     Neuro/Psych negative neurological ROS  negative psych ROS   GI/Hepatic negative GI ROS, Neg liver ROS,   Endo/Other  negative endocrine ROS  Renal/GU negative Renal ROS  Female GU complaint     Musculoskeletal   Abdominal Normal abdominal exam  (+)   Peds negative pediatric ROS (+)  Hematology negative hematology ROS (+)   Anesthesia Other Findings Uterine fibroid HX  Reproductive/Obstetrics (+) Pregnancy                             Anesthesia Physical Anesthesia Plan  ASA: II and emergent  Anesthesia Plan: Spinal   Post-op Pain Management:    Induction: Intravenous  PONV Risk Score and Plan:   Airway Management Planned: Nasal Cannula  Additional Equipment:   Intra-op Plan:   Post-operative Plan:   Informed Consent: I have reviewed the patients History and Physical, chart, labs and discussed the procedure including the risks, benefits and alternatives for the proposed anesthesia with the patient or authorized representative who has indicated his/her understanding and acceptance.   Dental advisory given  Plan Discussed with: CRNA and Surgeon  Anesthesia Plan Comments:         Anesthesia Quick Evaluation

## 2017-04-17 NOTE — Op Note (Signed)
Cesarean Section Operative Note    Rose Edwards   04/17/2017   Pre-operative Diagnosis:  1) intrauterine pregnancy at [redacted]w[redacted]d  2) history of prior cesarean section, desires repeat 3) active labor  Post-operative Diagnosis:  1) intrauterine pregnancy at [redacted]w[redacted]d  2) history of prior cesarean section, desires repeat 3) active labor  Procedure: Repeat Low Transverse Cesarean Section via Pfannenstiel Incision with double layer uterine closure  Surgeon: Surgeon(s) and Role:    Will Bonnet, MD - Primary   Anesthesia: spinal   Findings:  1) normal appearing fallopian tubes, and ovaries 2) left posterolateral fibroid, ~8cm 3) viable female infant with weight 3,010 grams (6 lb 10 oz), APGARs 9 and 9   Estimated Blood Loss: 600 mL  Total IV Fluids: 1,400 ml crystalloid  Urine Output: 100 mL clear urine  Specimens: none  Complications: no complications  Disposition: PACU - hemodynamically stable.   Maternal Condition: stable   Baby condition / location:  Couplet care / Skin to Skin  Procedure Details:  The patient was seen in the Holding Room. The risks, benefits, complications, treatment options, and expected outcomes were discussed with the patient. The patient concurred with the proposed plan, giving informed consent. identified as Rose Edwards and the procedure verified as C-Section Delivery. A Time Out was held and the above information confirmed.   After induction of anesthesia, the patient was draped and prepped in the usual sterile manner. A Pfannenstiel incision was made and carried down through the subcutaneous tissue to the fascia. Fascial incision was made and extended transversely. The fascia was separated from the underlying rectus tissue superiorly and inferiorly. The peritoneum was identified and entered. Peritoneal incision was extended longitudinally. The bladder flap was sharply freed from the lower uterine segment. A low transverse uterine incision was made and the  hysterotomy was extended with cranial-caudal tension. Delivered from cephalic presentation was a 3,010 gram Living newborn infant(s) or Female with Apgar scores of 9 at one minute and 9 at five minutes. Cord ph was not sent the umbilical cord was clamped and cut cord blood was not obtained for evaluation. The placenta was removed Intact and appeared normal. The uterine outline, tubes and ovaries appeared normal. The uterine incision was closed with running locked sutures of 0 Vicryl.  A second layer of the same suture was thrown in an imbricating fashion.  Hemostasis was assured.  The uterus was returned to the abdomen and the paracolic gutters were cleared of all clots and debris.  The rectus muscles were inspected and found to be hemostatic.  The On-Q catheter pumps were inserted in accordance with the manufacturer's recommendations.  The catheters were inserted approximately 4cm cephelad to the incision line, approximately 1cm apart, straddling the midline.  They were inserted to a depth of the 4th mark. They were positioned superficial to the rectus abdominus muscles and deep to the rectus fascia.    The fascia was then reapproximated with running sutures of 1-0 PDS, looped. Three interrupted 3-0 vicryl stitches where thrown along the subcutaneous tissue to reinforce the skin closure.  The subcuticular closure was performed using 4-0 monocryl. The skin closure was reinforced using surgical skin glue.  The On-Q catheters were bolused with 5 mL of 0.5% marcaine plain for a total of 10 mL.  The catheters were affixed to the skin with surgical skin glue, steri-strips, and tegaderm.    Instrument, sponge, and needle counts were correct prior the abdominal closure and were correct at the conclusion  of the case.  The patient received Ancef 2 gram IV prior to skin incision (within 30 minutes). For VTE prophylaxis she was wearing SCDs throughout the case.  Signed: Will Bonnet, MD 04/17/2017 2:46 AM

## 2017-04-17 NOTE — Anesthesia Preprocedure Evaluation (Deleted)
Anesthesia Evaluation  Patient identified by MRN, date of birth, ID band Patient awake    Reviewed: Allergy & Precautions, NPO status , Patient's Chart, lab work & pertinent test results  Airway Mallampati: II  TM Distance: >3 FB     Dental  (+) Teeth Intact   Pulmonary neg pulmonary ROS,    Pulmonary exam normal        Cardiovascular negative cardio ROS Normal cardiovascular exam     Neuro/Psych negative neurological ROS  negative psych ROS   GI/Hepatic negative GI ROS, Neg liver ROS,   Endo/Other  negative endocrine ROS  Renal/GU negative Renal ROS  Female GU complaint     Musculoskeletal negative musculoskeletal ROS (+)   Abdominal Normal abdominal exam  (+)   Peds negative pediatric ROS (+)  Hematology   Anesthesia Other Findings History reviewed. No pertinent past medical history. Uterine fibroids  Reproductive/Obstetrics (+) Pregnancy                             Anesthesia Physical Anesthesia Plan  ASA: II and emergent  Anesthesia Plan: Spinal   Post-op Pain Management:    Induction: Intravenous  PONV Risk Score and Plan:   Airway Management Planned: Nasal Cannula  Additional Equipment:   Intra-op Plan:   Post-operative Plan:   Informed Consent: I have reviewed the patients History and Physical, chart, labs and discussed the procedure including the risks, benefits and alternatives for the proposed anesthesia with the patient or authorized representative who has indicated his/her understanding and acceptance.   Dental advisory given  Plan Discussed with: CRNA and Surgeon  Anesthesia Plan Comments:         Anesthesia Quick Evaluation

## 2017-04-17 NOTE — Progress Notes (Signed)
Admit Date: 04/16/2017 Today's Date: 04/17/2017  Subjective: Postpartum Day 0: Cesarean Delivery Patient reports incisional pain and tolerating PO.    Objective: Vital signs in last 24 hours: Temp:  [97.4 F (36.3 C)-98.3 F (36.8 C)] 98 F (36.7 C) (02/09 0831) Pulse Rate:  [76-120] 84 (02/09 0831) Resp:  [13-26] 18 (02/09 0831) BP: (86-120)/(36-81) 95/55 (02/09 0831) SpO2:  [99 %-100 %] 100 % (02/09 0831) Weight:  [133 lb (60.3 kg)] 133 lb (60.3 kg) (02/09 0021)  Physical Exam:  General: alert, cooperative and no distress Lochia: appropriate Uterine Fundus: firm Incision: healing well, no significant drainage DVT Evaluation: No evidence of DVT seen on physical exam. Negative Homan's sign.  Recent Labs    04/17/17 0042 04/17/17 0653  HGB 12.1 11.4*  HCT 36.2 33.6*    Assessment/Plan: Status post Cesarean section. Doing well postoperatively.  Continue current care. Plans IUD for contraception Advance diet and ambulation over next 24 hours  Rose Edwards 04/17/2017, 9:55 AM

## 2017-04-17 NOTE — Discharge Summary (Signed)
OB Discharge Summary     Patient Name: Rose Edwards DOB: 1985-06-11 MRN: [id number]  Date of admission: 04/16/2017 Delivering MD: Prentice Docker, MD  Date of Delivery: 04/17/2017  Date of discharge: 04/18/2017  Admitting diagnosis: 39 wks preg contractions Intrauterine pregnancy: [redacted]w[redacted]d     Secondary diagnosis: None     Discharge diagnosis: Term Pregnancy Delivered                                                                                                Post partum procedures:none  Augmentation: n/a  Complications: None  Hospital course:     32 y.o. yo G3P1011 at [redacted]w[redacted]d was admitted to the hospital 04/16/2017 for active labor with a history of cesarean section.  She demonstrated cervical change, she desired repeat cesarean section. She was, therefore, taken to the OR for repeat cesarean section.  Membrane Rupture Time/Date: 2:01 AM ,04/17/2017   Patient delivered a Viable infant.04/17/2017  Details of operation can be found in separate operative note.  Pateint had an uncomplicated postpartum course.  She is ambulating, tolerating a regular diet, passing flatus, and urinating well. Patient is discharged home in stable condition on  04/18/17         Physical exam  Vitals:   04/18/17 0016 04/18/17 0447 04/18/17 0603 04/18/17 0722  BP: (!) 86/47 (!) 85/54 (!) 93/48 (!) 92/57  Pulse: 64 92 80 78  Resp: 16 16  18   Temp: 97.8 F (36.6 C) 98 F (36.7 C)  98 F (36.7 C)  TempSrc:  Oral  Oral  SpO2: 99% 99%  99%  Weight:      Height:       General: alert, cooperative and no distress Lochia: appropriate Uterine Fundus: firm Incision: Healing well with no significant drainage DVT Evaluation: No evidence of DVT seen on physical exam. Negative Homan's sign.  Labs: Lab Results  Component Value Date   WBC 7.5 04/17/2017   HGB 11.4 (L) 04/17/2017   HCT 33.6 (L) 04/17/2017   MCV 77.1 (L) 04/17/2017   PLT 141 (L) 04/17/2017    Discharge instruction: per After Visit  Summary.  Medications:  Allergies as of 04/18/2017   No Known Allergies     Medication List    TAKE these medications   acetaminophen 325 MG tablet Commonly known as:  TYLENOL Take 650 mg by mouth daily as needed for moderate pain or headache.   oxyCODONE-acetaminophen 5-325 MG tablet Commonly known as:  PERCOCET/ROXICET Take 1 tablet by mouth every 4 (four) hours as needed for moderate pain (pain score 4-7/10).   PRENATAL PO Take 1 tablet by mouth daily.   senna-docusate 8.6-50 MG tablet Commonly known as:  Senokot-S Take 2 tablets by mouth daily. Start taking on:  04/19/2017       Diet: routine diet  Activity: Advance as tolerated. Pelvic rest for 6 weeks.   Outpatient follow up: Follow-up Information    Will Bonnet, MD Follow up in 1 week(s).   Specialty:  Obstetrics and Gynecology Why:  post op incision check Contact information: [address] [place] [address] [phone]  Postpartum contraception: IUD Mirena Rhogam Given postpartum: no Rubella vaccine given postpartum: no Varicella vaccine given postpartum: no TDaP given antepartum or postpartum: received antepartum Flu vaccine given: Received antepartum  Newborn Data: Live born female  Birth Weight: 6 lb 10.2 oz (3010 g) APGAR: 9, 9  Newborn Delivery   Birth date/time:  04/17/2017 01:59:00 Delivery type:  C-Section, Low Vertical C-section categorization:  Repeat    Baby Feeding: Bottle and Breast  Disposition:home with mother  SIGNED: Barnett Applebaum, MD, [facility], [facility] 04/18/2017  10:47 AM

## 2017-04-17 NOTE — Transfer of Care (Signed)
Immediate Anesthesia Transfer of Care Note  Patient: Rose Edwards  Procedure(s) Performed: CESAREAN SECTION (N/A )  Patient Location: PACU  Anesthesia Type:Spinal  Level of Consciousness: awake, alert  and oriented  Airway & Oxygen Therapy: Patient Spontanous Breathing  Post-op Assessment: Report given to RN and Post -op Vital signs reviewed and stable  Post vital signs: Reviewed and stable  Last Vitals:  Vitals:   04/17/17 0004 04/17/17 0254  BP: 107/78 100/81  Pulse: (!) 113 84  Resp: 19 (!) 26  Temp: 36.6 C   SpO2:  100%    Last Pain:  Vitals:   04/17/17 0004  TempSrc: Oral  PainSc: 7          Complications: No apparent anesthesia complications

## 2017-04-17 NOTE — Anesthesia Procedure Notes (Signed)
Spinal  Patient location during procedure: OR Start time: 04/17/2017 1:30 PM End time: 04/17/2017 1:36 PM Staffing Anesthesiologist: Alvin Critchley, MD Resident/CRNA: Hedda Slade, CRNA Performed: resident/CRNA  Preanesthetic Checklist Completed: patient identified, site marked, surgical consent, pre-op evaluation, timeout performed, IV checked, risks and benefits discussed and monitors and equipment checked Spinal Block Patient position: sitting Prep: ChloraPrep Patient monitoring: heart rate, continuous pulse ox, blood pressure and cardiac monitor Approach: midline Location: L4-5 Injection technique: single-shot Needle Needle type: Whitacre and Introducer  Needle gauge: 24 G Needle length: 9 cm Assessment Sensory level: T4 Additional Notes Negative paresthesia. Negative blood return. Positive free-flowing CSF. Expiration date of kit checked and confirmed. Patient tolerated procedure well, without complications.

## 2017-04-17 NOTE — Anesthesia Post-op Follow-up Note (Signed)
Anesthesia QCDR form completed.        

## 2017-04-18 MED ORDER — SENNOSIDES-DOCUSATE SODIUM 8.6-50 MG PO TABS: 2.0000 | ORAL_TABLET | ORAL | 1 refills | Status: DC

## 2017-04-18 MED ORDER — OXYCODONE-ACETAMINOPHEN 5-325 MG PO TABS: 1.0000 | ORAL_TABLET | ORAL | 0 refills | Status: DC | PRN

## 2017-04-18 NOTE — Anesthesia Postprocedure Evaluation (Signed)
Anesthesia Post Note  Patient: Rose Edwards  Procedure(s) Performed: CESAREAN SECTION (N/A )  Patient location during evaluation: Mother Baby Anesthesia Type: Spinal Level of consciousness: awake and alert Pain management: pain level controlled Vital Signs Assessment: post-procedure vital signs reviewed and stable Respiratory status: spontaneous breathing, nonlabored ventilation and respiratory function stable Cardiovascular status: stable Postop Assessment: no headache and no backache Anesthetic complications: no     Last Vitals:  Vitals:   04/18/17 0603 04/18/17 0722  BP: (!) 93/48 (!) 92/57  Pulse: 80 78  Resp:  18  Temp:  36.7 C  SpO2:  99%    Last Pain:  Vitals:   04/18/17 0722  TempSrc: Oral  PainSc:                  [name]

## 2017-04-18 NOTE — Anesthesia Post-op Follow-up Note (Signed)
  Anesthesia Pain Follow-up Note  Patient: Rose Edwards  Day #: 2  Date of Follow-up: 04/18/2017 Time: 9:13 AM  Last Vitals:  Vitals:   04/18/17 0603 04/18/17 0722  BP: (!) 93/48 (!) 92/57  Pulse: 80 78  Resp:  18  Temp:  36.7 C  SpO2:  99%    Level of Consciousness: alert  Pain: mild   Side Effects:None  Catheter Site Exam:clean, dry     Plan: D/C from anesthesia care at surgeon's request  Roxie Gueye

## 2017-04-18 NOTE — Progress Notes (Signed)
Admit Date: 04/16/2017 Today's Date: 04/18/2017  Subjective: Postpartum Day 1: Cesarean Delivery Patient reports incisional pain and tolerating PO.    Objective: Vital signs in last 24 hours: Temp:  [97.8 F (36.6 C)-98.6 F (37 C)] 98 F (36.7 C) (02/10 0722) Pulse Rate:  [64-92] 78 (02/10 0722) Resp:  [16-18] 18 (02/10 0722) BP: (85-102)/(47-62) 92/57 (02/10 0722) SpO2:  [98 %-99 %] 99 % (02/10 0722)  Physical Exam:  General: alert, cooperative and no distress Lochia: appropriate Uterine Fundus: firm Incision: healing well, no significant drainage DVT Evaluation: No evidence of DVT seen on physical exam. Negative Homan's sign.  Recent Labs    04/17/17 0042 04/17/17 0653  HGB 12.1 11.4*  HCT 36.2 33.6*   Assessment/Plan: Status post Cesarean section.  Doing well postoperatively.  Continue current care. Plans IUD for contraception Home today  Rose Edwards 04/18/2017, 10:48 AM

## 2017-04-18 NOTE — Progress Notes (Signed)
Dc inst reviewed with pt and fob.  Verb u/o of f/u appt and how to remove On Q pump.

## 2017-04-19 ENCOUNTER — Encounter: Payer: Self-pay | Admitting: Obstetrics and Gynecology

## 2017-04-20 ENCOUNTER — Other Ambulatory Visit: Payer: Self-pay

## 2017-04-20 ENCOUNTER — Encounter: Payer: 59 | Admitting: Obstetrics and Gynecology

## 2017-04-20 ENCOUNTER — Emergency Department
Admission: EM | Admit: 2017-04-20 | Discharge: 2017-04-20 | Disposition: A | Discharge status: Home / Self Care | Payer: 59 | Attending: Emergency Medicine | Admitting: Emergency Medicine

## 2017-04-20 DIAGNOSIS — G8918 Other acute postprocedural pain: Secondary | ICD-10-CM | POA: Diagnosis not present

## 2017-04-20 DIAGNOSIS — Z79899 Other long term (current) drug therapy: Secondary | ICD-10-CM | POA: Diagnosis not present

## 2017-04-20 NOTE — ED Notes (Signed)
Pt NAD, VSS, Ambulatory to POV without difficulty. Discharge instructions and follow up discussed.

## 2017-04-20 NOTE — ED Notes (Addendum)
Pt states that last night a child kicked her in stomach where c-section was performed. PT believes that her medication is not being distributed as it was before. Increased blood noted to pump. Family at bedside. Pt is alert and oriented x 4.

## 2017-04-20 NOTE — ED Triage Notes (Signed)
Pt was discharged home Sunday after a c-section Saturday with a pain pump to surgical site. States tonight son bumped into abd and since then has had increased blood noted to pump with less pain control.

## 2017-04-20 NOTE — ED Provider Notes (Signed)
[facility] Emergency Department Provider Note   ____________________________________________    I have reviewed the triage vital signs and the nursing notes.   HISTORY  Chief Complaint Post-op Problem     HPI Rose Edwards is a 32 y.o. female who presents with a postop problem.  Patient recent C-section 3 days ago.  Had pain pump placed by OB.  Last night 53-year-old boy accidentally ran into her abdomen.  At the time she felt okay but later in the night she developed cramping pain in her lower pelvis, she did take oxycodone with some relief.  Again in the morning she had pain in her pelvis and took another pain pill.  She noticed some blood around the insertion site of the pain pump catheter became concerned so she came to the emergency department.  No fevers or chills.  Baby is doing well.   No past medical history on file.  Patient Active Problem List   Diagnosis Date Noted  . Normal labor 04/17/2017  . Indication for care in labor and delivery, antepartum 04/16/2017  . 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 Uterine fibroid in pregnancy 09/22/2016  . Supervision of high risk pregnancy, antepartum 09/08/2016  . History of cesarean section, unknown scar 09/08/2016    Past Surgical History:  Procedure Laterality Date  . CESAREAN SECTION  2012   [place]  . CESAREAN SECTION N/A 04/17/2017   Procedure: CESAREAN SECTION;  Surgeon: Will Bonnet, MD;  Location: [facility] ORS;  Service: Obstetrics;  Laterality: N/A;    Prior to Admission medications   Medication Sig Start Date End Date Taking? Authorizing Provider  acetaminophen (TYLENOL) 325 MG tablet Take 650 mg by mouth daily as needed for moderate pain or headache.    [provider]  oxyCODONE-acetaminophen (PERCOCET/ROXICET) 5-325 MG tablet Take 1 tablet by mouth every 4 (four) hours as needed for moderate pain (pain score 4-7/10). 04/18/17   Gae Dry, MD  Prenatal Vit-Fe  Fumarate-FA (PRENATAL PO) Take 1 tablet by mouth daily.    [provider]  senna-docusate (SENOKOT-S) 8.6-50 MG tablet Take 2 tablets by mouth daily. 04/19/17   Gae Dry, MD     Allergies Patient has no known allergies.  Family History  Problem Relation Age of Onset  . Hypertension Mother     Social History Social History   Tobacco Use  . Smoking status: Never Smoker  . Smokeless tobacco: Never Used  Substance Use Topics  . Alcohol use: No  . Drug use: No    Review of Systems  Constitutional: No fever   Gastrointestinal: Lower abdominal cramping as above Genitourinary: Negative for dysuria.  Skin: Negative for rash.    ____________________________________________   PHYSICAL EXAM:  VITAL SIGNS: ED Triage Vitals [04/20/17 0540]  Enc Vitals Group     BP 106/73     Pulse Rate 83     Resp 20     Temp 98 F (36.7 C)     Temp Source Oral     SpO2 97 %     Weight 60.3 kg (133 lb)     Height 1.549 m (5\' 1" )     Head Circumference      Peak Flow      Pain Score 6     Pain Loc      Pain Edu?      Excl. in [place]?     Constitutional: Alert and oriented. No acute distress. Pleasant and  interactive Eyes: Conjunctivae are normal.   Nose: No congestion/rhinnorhea.  Cardiovascular: Normal rate, regular rhythm. Kermit Balo peripheral circulation. Respiratory: Normal respiratory effort.  No retractions. Gastrointestinal: Soft. No distention.  Dressings intact, Tegaderm overlying pain pump catheter, small amount of blood surrounding insertion site of catheter, no active bleeding.  No erythema purulent discharge.  Pain pump reservoir is empty.  No significant tenderness to palpation Genitourinary: deferred Musculoskeletal:  Warm and well perfused Neurologic:  Normal speech and language.  Skin:  Skin is warm, dry and intact. No rash noted. Psychiatric: Mood and affect are normal. Speech and behavior are  normal.  ____________________________________________   LABS (all labs ordered are listed, but only abnormal results are displayed)  Labs Reviewed - No data to display ____________________________________________  EKG  None ____________________________________________  RADIOLOGY  None ____________________________________________   PROCEDURES  Procedure(s) performed: No  Procedures   Critical Care performed:No ____________________________________________   INITIAL IMPRESSION / ASSESSMENT AND PLAN / ED COURSE  Pertinent labs & imaging results that were available during my care of the patient were reviewed by me and considered in my medical decision making (see chart for details).  Discussed with Dr. Georgianne Fick of OB/GYN who knows the patient well.  He recommends if pain pump reservoir is empty to go ahead and pull it and so this is what we did without any difficulty.  Clean dressing applied no bleeding.  No discharge.  Pain is likely related to pain pump reservoir running empty last night.  Discussed with patient and husband, close follow-up with OB/GYN if any concerns.  They agree with plan.    ____________________________________________   FINAL CLINICAL IMPRESSION(S) / ED DIAGNOSES  Final diagnoses:  Post-op pain        Note:  This document was prepared using Dragon voice recognition software and may include unintentional dictation errors.    Lavonia Drafts, MD 04/20/17 [phone]

## 2017-04-21 ENCOUNTER — Encounter: Payer: 59 | Admitting: Obstetrics and Gynecology

## 2017-04-21 ENCOUNTER — Inpatient Hospital Stay: Admission: RE | Admit: 2017-04-21 | Payer: Managed Care, Other (non HMO) | Source: Ambulatory Visit

## 2017-04-21 ENCOUNTER — Encounter: Payer: 59 | Admitting: Obstetrics & Gynecology

## 2017-04-21 LAB — RPR: RPR: NONREACTIVE

## 2017-04-21 IMAGING — US US OB COMP LESS 14 WK
1 series · 13 of 28 positions shown · non-contrast
Comparison: None.

CLINICAL DATA: Miscarriage 1 week ago. Pelvic pain and bleeding for
1 week. Bleeding is increased and patient is passing large clots.
Estimated gestational age by LMP is 10 weeks 4 days. Quantitative
beta HCG is 564.

EXAM:
OBSTETRIC <14 WK US AND TRANSVAGINAL OB US
TECHNIQUE: Both transabdominal and transvaginal ultrasound examinations were
performed for complete evaluation of the gestation as well as the
maternal uterus, adnexal regions, and pelvic cul-de-sac.
Transvaginal technique was performed to assess early pregnancy.

[Series 1: us ob comp less 14 wk · 0.15mm/px · 99 acquisitions, 13 frames shown]
[im 4/99]
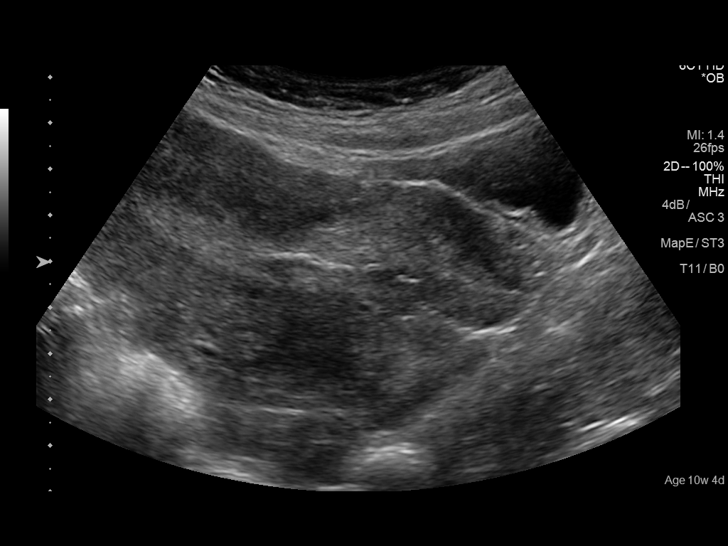
[im 11/99]
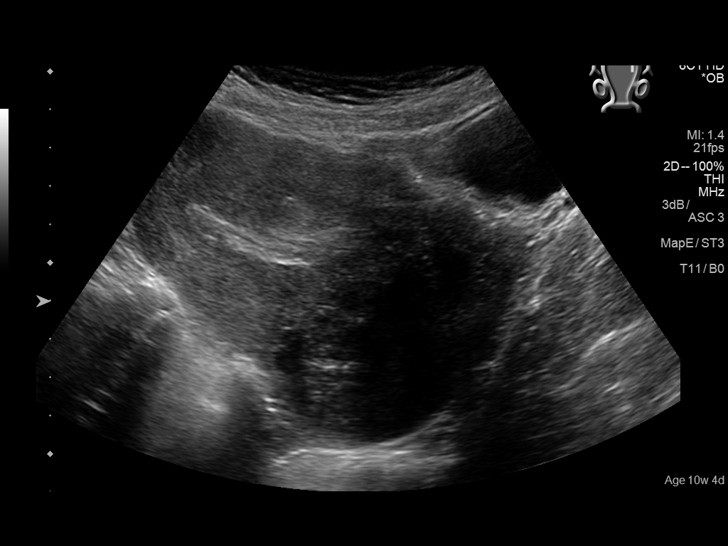
[im 19/99]
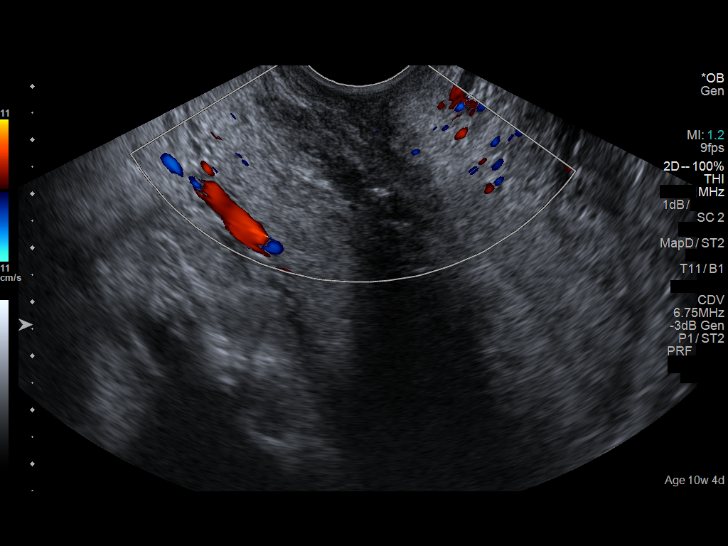
[im 26/99]
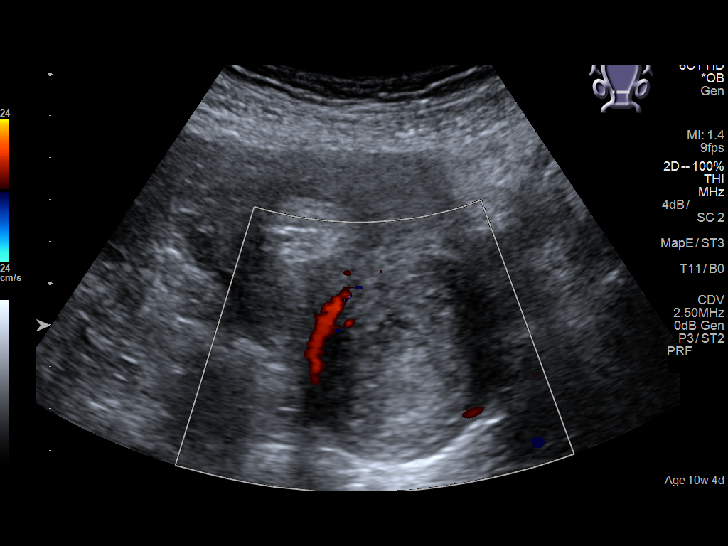
[im 33/99]
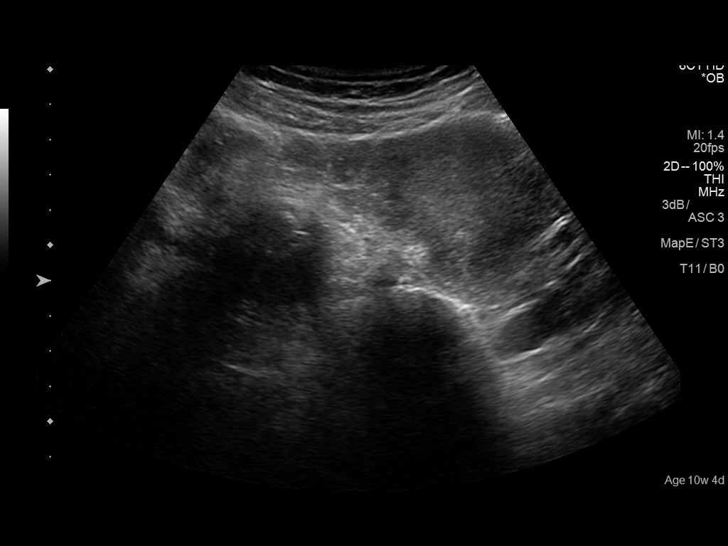
[im 40/99]
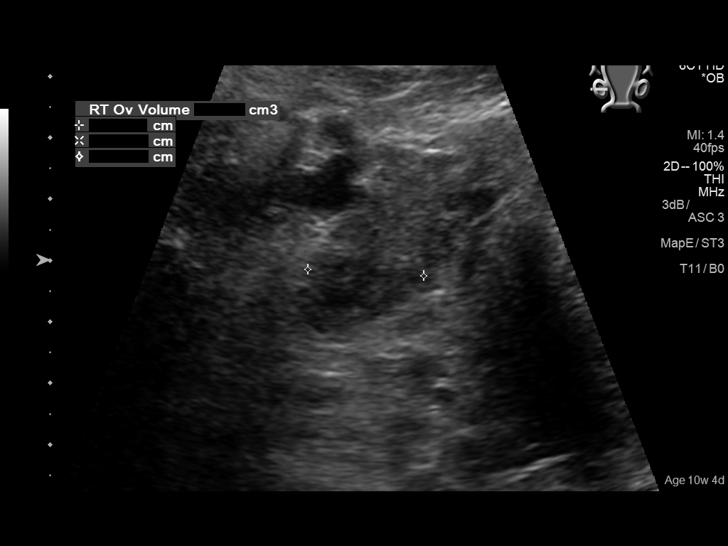
[im 51/99]
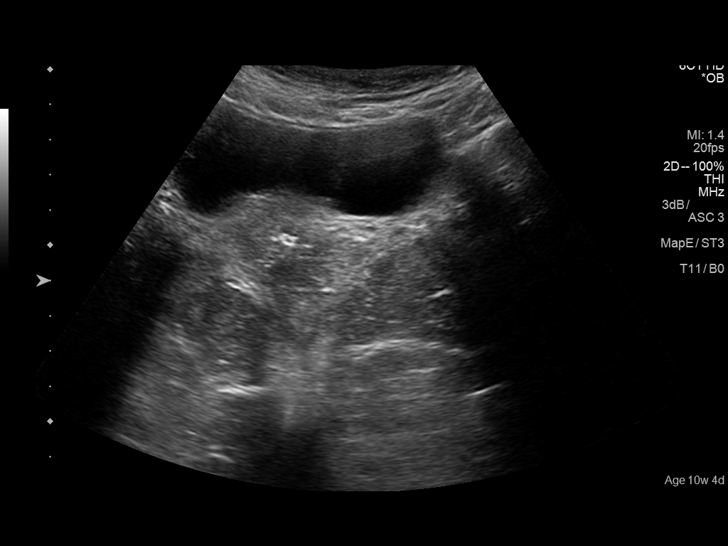
[im 59/99]
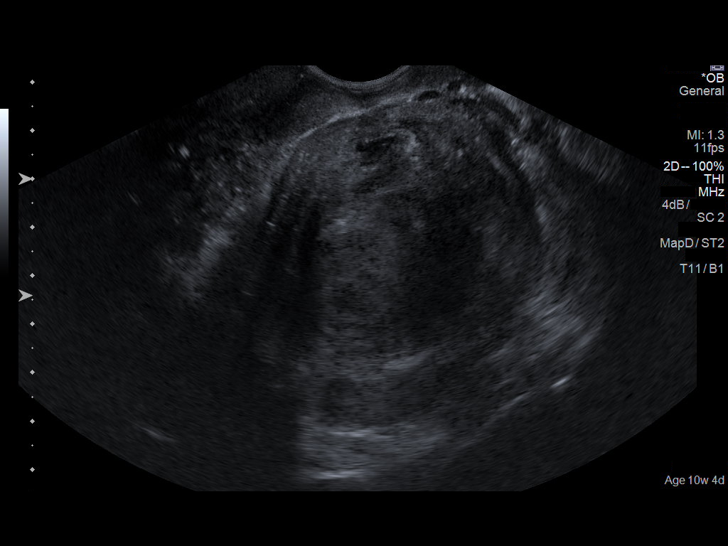
[im 66/99]
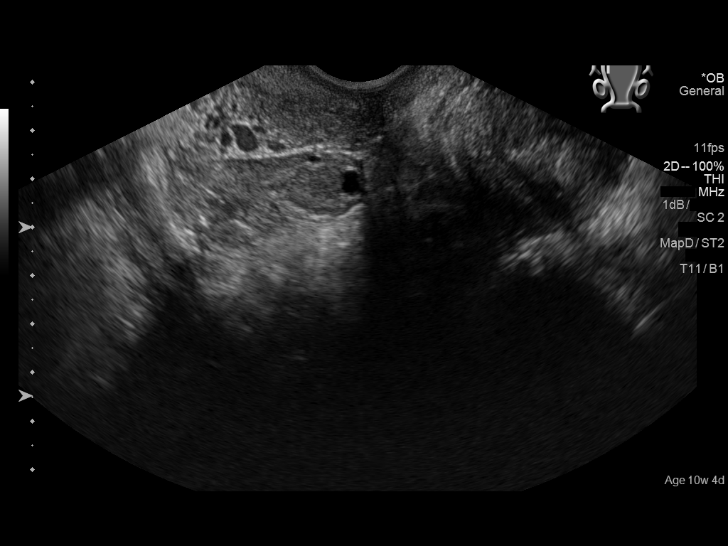
[im 73/99]
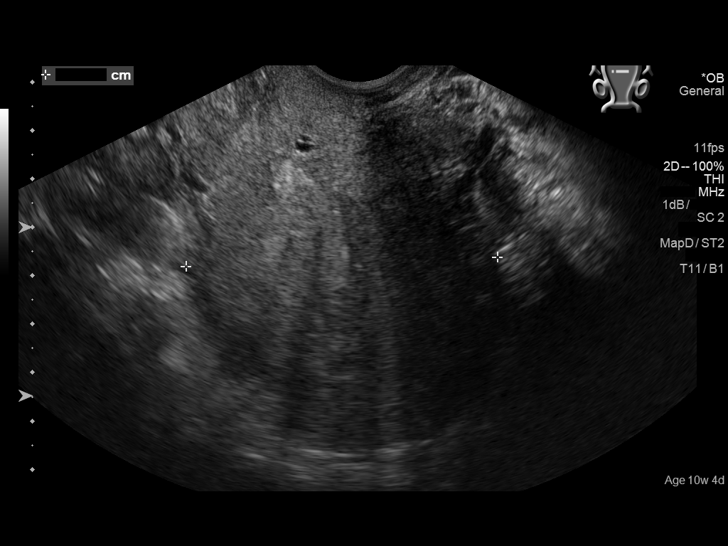
[im 80/99]
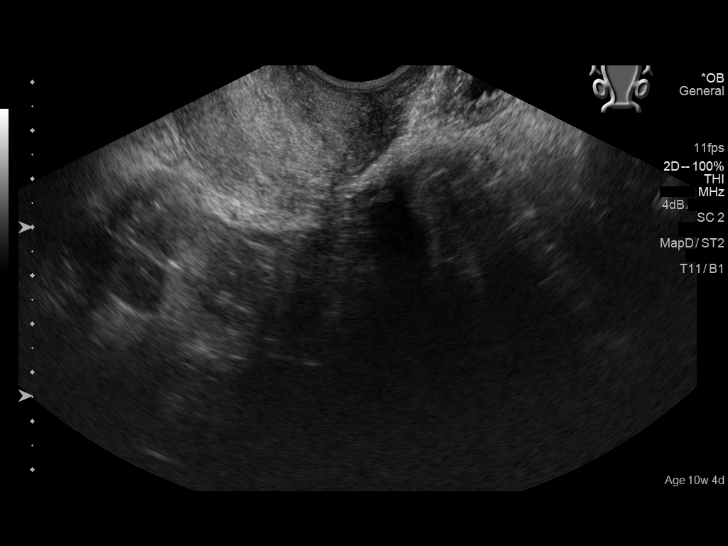
[im 88/99]
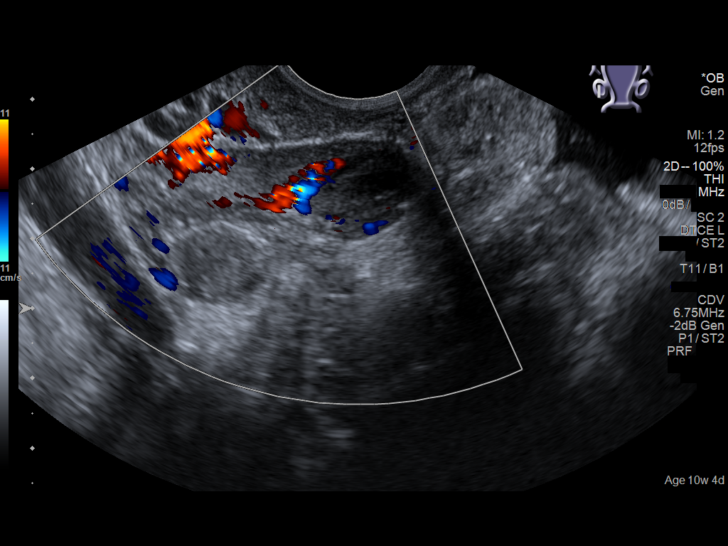
[im 95/99]
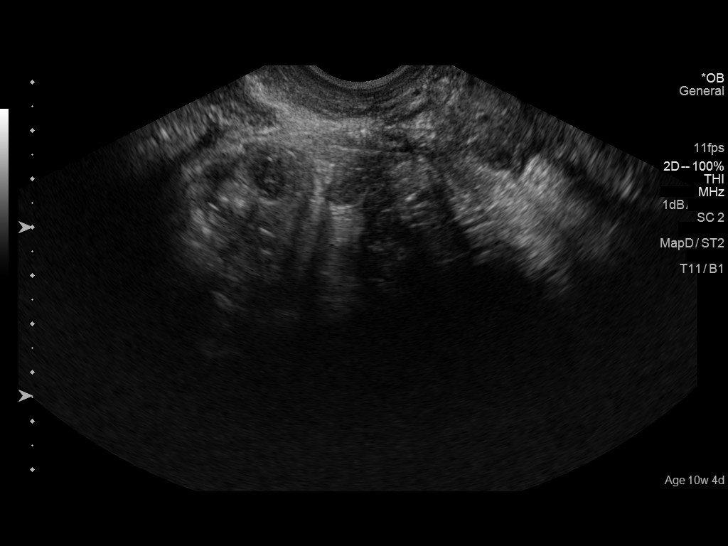

[13 of 28 positions shown; findings below may reference images not displayed]

FINDINGS: Intrauterine gestational sac: Not identified.

Yolk sac:  Not identified.

Embryo:  Not identified.

Cardiac Activity: Not identified.

Maternal uterus/adnexae: Uterus appears retroverted. There is a
heterogeneous mostly hypoechoic mass in the left posterior lower
uterine segment, measuring 5.4 cm maximal diameter. This is
consistent with a large fibroid. The endometrium is diffusely
thickened and heterogeneous, measuring up to 24 mm in thickness. No
endometrial fluid. No flow demonstrated within the endometrium on
color flow Doppler imaging, suggesting this to represent hemorrhagic
products rather than retained products of conception. The right
ovary is visualized and appears normal. Probable corpus luteal cyst.
Left ovary is not identified. No free fluid in the pelvis.
IMPRESSION: No intrauterine gestational sac is identified. Thickened and
heterogeneous endometrium likely representing hemorrhagic products.
No flow to suggest retained products of conception.

## 2017-04-22 ENCOUNTER — Inpatient Hospital Stay: Admission: RE | Admit: 2017-04-22 | Payer: 59 | Source: Ambulatory Visit | Admitting: Obstetrics and Gynecology

## 2017-04-22 ENCOUNTER — Encounter: Admission: RE | Payer: Self-pay | Source: Ambulatory Visit

## 2017-04-22 SURGERY — Surgical Case
Anesthesia: Choice

## 2017-04-23 ENCOUNTER — Encounter: Payer: Self-pay | Admitting: Obstetrics and Gynecology

## 2017-04-23 ENCOUNTER — Telehealth: Payer: Self-pay | Admitting: Obstetrics and Gynecology

## 2017-04-23 ENCOUNTER — Ambulatory Visit (INDEPENDENT_AMBULATORY_CARE_PROVIDER_SITE_OTHER): Payer: 59 | Admitting: Obstetrics and Gynecology

## 2017-04-23 VITALS — BP 100/60 | HR 89 | Ht 61.0 in | Wt 119.0 lb

## 2017-04-23 DIAGNOSIS — O0990 Supervision of high risk pregnancy, unspecified, unspecified trimester: Secondary | ICD-10-CM

## 2017-04-23 DIAGNOSIS — O3410 Maternal care for benign tumor of corpus uteri, unspecified trimester: Secondary | ICD-10-CM

## 2017-04-23 DIAGNOSIS — R300 Dysuria: Secondary | ICD-10-CM

## 2017-04-23 DIAGNOSIS — D259 Leiomyoma of uterus, unspecified: Secondary | ICD-10-CM

## 2017-04-23 DIAGNOSIS — Z98891 History of uterine scar from previous surgery: Secondary | ICD-10-CM

## 2017-04-23 LAB — POCT URINALYSIS DIPSTICK
Bilirubin, UA: NEGATIVE
GLUCOSE UA: NEGATIVE
Ketones, UA: NEGATIVE
LEUKOCYTES UA: NEGATIVE
NITRITE UA: NEGATIVE
Spec Grav, UA: 1.025 (ref 1.010–1.025)
Urobilinogen, UA: 0.2 E.U./dL
pH, UA: 6 (ref 5.0–8.0)

## 2017-04-23 NOTE — Telephone Encounter (Signed)
3/26 at 850 for mirena insert with sdj

## 2017-04-23 NOTE — Addendum Note (Signed)
Addended by: Clemetine Marker D on: 04/23/2017 02:16 PM   Modules accepted: Orders

## 2017-04-23 NOTE — Progress Notes (Signed)
  OBSTETRICS POSTPARTUM CLINIC PROGRESS NOTE  Subjective:     Rose Edwards is a 32 y.o. G3P2012 female who presents for a postpartum visit. She is 1 week postpartum following a Term pregnancy and delivery by C-section.  I have fully reviewed the prenatal and intrapartum course. Anesthesia: spinal.  Postpartum course has been complicated by uncomplicated.  Baby is feeding by Breast.  Bleeding: patient has not  resumed menses.  Bowel function is normal. Bladder function is dysuria.  Patient is not sexually active. Contraception method desired is IUD.   The following portions of the patient's history were reviewed and updated as appropriate: allergies, current medications, past family history, past medical history, past social history and past surgical history.  Review of Systems Pertinent items are noted in HPI.  Objective:    BP 100/60   Pulse 89   Ht 5\' 1"  (1.549 m)   Wt 119 lb (54 kg)   LMP 07/23/2016   Breastfeeding? Yes   BMI 22.48 kg/m   General:  alert and no distress   Breasts:  inspection negative, no nipple discharge or bleeding, no masses or nodularity palpable  Lungs: clear to auscultation bilaterally  Heart:  regular rate and rhythm, S1, S2 normal, no murmur, click, rub or gallop  Abdomen: soft, non-tender; bowel sounds normal; no masses,  no organomegaly.  Well healed Pfannenstiel incision   Vulva:  normal  Vagina: normal vagina, no discharge, exudate, lesion, or erythema  Cervix:  no cervical motion tenderness and no lesions  Corpus: normal size, contour, position, consistency, mobility, non-tender  Adnexa:  normal adnexa and no mass, fullness, tenderness  Rectal Exam: Not performed.          Assessment:  Post Partum Care visit 1. Postpartum Cesarean section Doing well, uncomplicated    2. Dysuria - Urine Culture   Plan:  See orders and Patient Instructions Follow up in: 5 weeks or as needed.    Dysuria, UA and urine culutre Pap needed at next  visit Desires Mirena insertion  [place] 04/23/2017  1:58 PM

## 2017-04-24 LAB — URINE CULTURE

## 2017-04-26 NOTE — Progress Notes (Signed)
negative

## 2017-05-04 NOTE — Telephone Encounter (Signed)
Noted. Will order to arrive by apt date/time. 

## 2017-05-28 NOTE — Telephone Encounter (Signed)
Mirena reserved

## 2017-06-01 ENCOUNTER — Ambulatory Visit: Payer: 59 | Admitting: Obstetrics and Gynecology

## 2017-06-01 NOTE — Telephone Encounter (Signed)
Patient had to reschedule appt to 06/10/17 at 9:40 with [facility] for mirena insertion

## 2017-06-04 NOTE — Telephone Encounter (Signed)
Mirena was reserved for this patient for 06/01/17. Reviewed chart. Apt was r/s to 06/10/17. Mirena remains reserved.

## 2017-06-10 ENCOUNTER — Encounter: Payer: Self-pay | Admitting: Obstetrics and Gynecology

## 2017-06-10 ENCOUNTER — Ambulatory Visit (INDEPENDENT_AMBULATORY_CARE_PROVIDER_SITE_OTHER): Payer: 59 | Admitting: Obstetrics and Gynecology

## 2017-06-10 DIAGNOSIS — Z30430 Encounter for insertion of intrauterine contraceptive device: Secondary | ICD-10-CM | POA: Diagnosis not present

## 2017-06-10 MED ORDER — LEVONORGESTREL 20 MCG/24HR IU IUD: 1.0000 | INTRAUTERINE_SYSTEM | Freq: Once | INTRAUTERINE | 0 refills | Status: AC

## 2017-06-10 NOTE — Patient Instructions (Signed)

## 2017-06-10 NOTE — Progress Notes (Signed)
Postpartum Visit   Chief Complaint  Patient presents with  . 6 week post partum   History of Present Illness: Patient is a 32 y.o. [id number] presents for postpartum visit.  Date of delivery: 04/17/2017 Type of delivery: C-Section Episiotomy No.  Laceration: no Pregnancy or labor problems:  no Any problems since the delivery:  no  Newborn Details:  SINGLETON :  1. Baby's name: Adan. Birth weight: 6.10 Maternal Details:  Breast Feeding:  yes Post partum depression/anxiety noted:  no Edinburgh Post-Partum Depression Score:  6  Date of last PAP: 12/31/2015/NORMAL  Past Medical History: denies   Past Surgical History:  Procedure Laterality Date  . CESAREAN SECTION  2012   [place]  . CESAREAN SECTION N/A 04/17/2017   Procedure: CESAREAN SECTION;  Surgeon: Will Bonnet, MD;  Location: [facility] ORS;  Service: Obstetrics;  Laterality: N/A;    Prior to Admission medications   Medication Sig Start Date End Date Taking? Authorizing Provider  acetaminophen (TYLENOL) 325 MG tablet Take 650 mg by mouth daily as needed for moderate pain or headache.    [provider]  Prenatal Vit-Fe Fumarate-FA (PRENATAL PO) Take 1 tablet by mouth daily.    [provider]   Allergies: No Known Allergies   Social History   Socioeconomic History  . Marital status: Married    Spouse name: Not on file  . Number of children: Not on file  . Years of education: Not on file  . Highest education level: Not on file  Occupational History  . Not on file  Social Needs  . Financial resource strain: Not on file  . Food insecurity:    Worry: Not on file    Inability: Not on file  . Transportation needs:    Medical: Not on file    Non-medical: Not on file  Tobacco Use  . Smoking status: Never Smoker  . Smokeless tobacco: Never Used  Substance and Sexual Activity  . Alcohol use: No  . Drug use: No  . Sexual activity: Yes    Birth control/protection: None  Lifestyle  . Physical  activity:    Days per week: Not on file    Minutes per session: Not on file  . Stress: Not on file  Relationships  . Social connections:    Talks on phone: Not on file    Gets together: Not on file    Attends religious service: Not on file    Active member of club or organization: Not on file    Attends meetings of clubs or organizations: Not on file    Relationship status: Not on file  . Intimate partner violence:    Fear of current or ex partner: Not on file    Emotionally abused: Not on file    Physically abused: Not on file    Forced sexual activity: Not on file  Other Topics Concern  . Not on file  Social History Narrative  . Not on file    Family History  Problem Relation Age of Onset  . Hypertension Mother     Review of Systems  Constitutional: Negative.   HENT: Negative.   Eyes: Negative.   Respiratory: Negative.   Cardiovascular: Negative.   Gastrointestinal: Negative.   Genitourinary: Negative.   Musculoskeletal: Negative.   Skin: Negative.   Neurological: Negative.   Psychiatric/Behavioral: Negative.      Physical Exam BP 118/74   Ht 5\' 1"  (1.549 m)   Wt 116 lb (52.6 kg)  LMP 07/23/2016   BMI 21.92 kg/m   Physical Exam  Constitutional: She is oriented to person, place, and time. She appears well-developed and well-nourished. No distress.  Genitourinary: Vagina normal. Pelvic exam was performed with patient supine. There is no rash, tenderness or lesion on the right labia. There is no rash, tenderness or lesion on the left labia. Vagina exhibits no lesion. No tenderness or bleeding in the vagina. No signs of injury around the vagina. Right adnexum does not display mass, does not display tenderness and does not display fullness. Left adnexum does not display mass, does not display tenderness and does not display fullness. Cervix does not exhibit motion tenderness, lesion or polyp.   Uterus is enlarged (fibroid (known entity)) and mobile. Uterus is not  tender.  Eyes: EOM are normal. No scleral icterus.  Neck: Normal range of motion. Neck supple.  Cardiovascular: Normal rate and regular rhythm.  Pulmonary/Chest: Effort normal and breath sounds normal. No respiratory distress. She has no wheezes. She has no rales.  Abdominal: Soft. Bowel sounds are normal. She exhibits no distension and no mass. There is no tenderness. There is no rebound and no guarding.  Musculoskeletal: Normal range of motion. She exhibits no edema.  Neurological: She is alert and oriented to person, place, and time. No cranial nerve deficit.  Skin: Skin is warm and dry. No erythema.  Psychiatric: She has a normal mood and affect. Her behavior is normal. Judgment normal.    IUD Insertion Procedure Note Patient identified, informed consent performed, consent signed.   Discussed risks of irregular bleeding, cramping, infection, malpositioning, expulsion or uterine perforation of the IUD (1:1000 placements)  which may require further procedure such as laparoscopy.  IUD while effective at preventing pregnancy do not prevent transmission of sexually transmitted diseases and use of barrier methods for this purpose was discussed. Time out was performed.  Urine pregnancy test negative.  Speculum placed in the vagina.  Cervix visualized.  Cleaned with Betadine x 2.  Grasped anteriorly with a single tooth tenaculum.  Uterus sounded to 8 cm. IUD placed per manufacturer's recommendations.  Strings trimmed to 3 cm. Tenaculum was removed, good hemostasis noted.  Patient tolerated procedure well.   Patient was given post-procedure instructions.  She was advised to have backup contraception for one week.    Female Chaperone present during breast and/or pelvic exam.  Assessment: 32 y.o. [id number] presenting for 6 week postpartum visit  Plan: Problem List Items Addressed This Visit    None    Visit Diagnoses    Postpartum care and examination    -  Primary   Encounter for IUD insertion        Relevant Medications   levonorgestrel (MIRENA) 20 MCG/24HR IUD     1) Contraception Education given regarding options for contraception, including IUD placement.  2)  Pap - ASCCP guidelines and rational discussed.  Patient opts for routine screening interval  3) Patient underwent screening for postpartum depression with no concerns noted.  4) Follow up 1 year for routine annual exam  Prentice Docker, MD 06/10/2017 10:26 AM

## 2017-06-23 NOTE — Telephone Encounter (Signed)
Mirena received/charged 06/10/17

## 2017-07-07 ENCOUNTER — Ambulatory Visit: Payer: 59 | Admitting: Obstetrics and Gynecology

## 2019-11-08 ENCOUNTER — Other Ambulatory Visit: Payer: Self-pay

## 2019-11-08 ENCOUNTER — Emergency Department (HOSPITAL_COMMUNITY): Payer: Self-pay

## 2019-11-08 ENCOUNTER — Emergency Department (HOSPITAL_COMMUNITY)
Admission: EM | Admit: 2019-11-08 | Discharge: 2019-11-08 | Disposition: A | Discharge status: Home / Self Care | Payer: Self-pay | Attending: Emergency Medicine | Admitting: Emergency Medicine

## 2019-11-08 DIAGNOSIS — Z9289 Personal history of other medical treatment: Secondary | ICD-10-CM

## 2019-11-08 DIAGNOSIS — Z111 Encounter for screening for respiratory tuberculosis: Secondary | ICD-10-CM | POA: Insufficient documentation

## 2019-11-08 NOTE — ED Triage Notes (Signed)
Pt had her TB skin test read on Monday at [facility], told her at first it was negative then told her no it was positive. Pt works at daycare and needs chest xray. Has no insurance so was told to go to ED. Pt denies any cough, night sweats, etc.

## 2019-11-08 NOTE — ED Provider Notes (Signed)
[facility] DEPT Provider Note   CSN: [id number] Arrival date & time: 11/08/19  1558     History Chief Complaint  Patient presents with  . positve TB test    needs xray    Rose Edwards is a 34 y.o. female noncontributory past medical history.   HPI Patient presents to emergency room today with chief complaint of positive TB test.  Patient states she was born in [place] and received the TB vaccination as a child.  She has never had a TB test before.  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  When she went back x48 hours later to have it checked the nurse told her it was negative.  However when it was checked by the attending they were concerned the test was positive based on the size.  Patient's work place is requiring her to have a chest x-ray to rule out TB.  Patient denies having any symptoms.  No interventions needed for symptoms prior to arrival.  She denies night sweats, cough, chest pain, fatigue, loss of appetite, weight loss, fever, hemoptysis, back pain.  Denies any history of IV drug use.        No past medical history on file.  Patient Active Problem List   Diagnosis Date Noted  . Normal labor 04/17/2017  . Indication for care in labor and delivery, antepartum 04/16/2017  . 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 Uterine fibroid in pregnancy 09/22/2016  . Supervision of high risk pregnancy, antepartum 09/08/2016  . History of cesarean section, unknown scar 09/08/2016    Past Surgical History:  Procedure Laterality Date  . CESAREAN SECTION  2012   [place]  . CESAREAN SECTION N/A 04/17/2017   Procedure: CESAREAN SECTION;  Surgeon: Will Bonnet, MD;  Location: [facility] ORS;  Service: Obstetrics;  Laterality: N/A;     OB History    Gravida  3   Para  2   Term  2   Preterm      AB  1   Living  2     SAB  1   TAB      Ectopic      Multiple  0   Live Births  2             Family History  Problem Relation Age of Onset  . Hypertension Mother     Social History   Tobacco Use  . Smoking status: Never Smoker  . Smokeless tobacco: Never Used  Substance Use Topics  . Alcohol use: No  . Drug use: No    Home Medications Prior to Admission medications   Medication Sig Start Date End Date Taking? Authorizing Provider  acetaminophen (TYLENOL) 325 MG tablet Take 650 mg by mouth daily as needed for moderate pain or headache.    [provider]  levonorgestrel (MIRENA) 20 MCG/24HR IUD 1 Intra Uterine Device (1 each total) by Intrauterine route once for 1 dose. 06/10/17 06/10/17  Will Bonnet, MD  Prenatal Vit-Fe Fumarate-FA (PRENATAL PO) Take 1 tablet by mouth daily.    [provider]    Allergies    Patient has no known allergies.  Review of Systems   Review of Systems All other systems are reviewed and are negative for acute change except as noted in the HPI.  Physical Exam Updated Vital Signs BP 114/81   Pulse 91  Temp 98.5 F (36.9 C)   Resp 17   SpO2 99%   Physical Exam Vitals and nursing note reviewed.  Constitutional:      Appearance: She is well-developed. She is not ill-appearing or toxic-appearing.  HENT:     Head: Normocephalic and atraumatic.     Nose: Nose normal.  Eyes:     General: No scleral icterus.       Right eye: No discharge.        Left eye: No discharge.     Conjunctiva/sclera: Conjunctivae normal.  Neck:     Vascular: No JVD.  Cardiovascular:     Rate and Rhythm: Normal rate and regular rhythm.     Pulses: Normal pulses.     Heart sounds: Normal heart sounds.  Pulmonary:     Effort: Pulmonary effort is normal.     Breath sounds: Normal breath sounds.  Abdominal:     General: There is no distension.  Musculoskeletal:        General: Normal range of motion.     Cervical back: Normal range of motion.     Comments: 1.5 x 1 cm area of erythema from TB test on right forearm. No  palpable fluctuance or raised area.  Skin:    General: Skin is warm and dry.  Neurological:     Mental Status: She is oriented to person, place, and time.     GCS: GCS eye subscore is 4. GCS verbal subscore is 5. GCS motor subscore is 6.     Comments: Fluent speech, no facial droop.  Psychiatric:        Behavior: Behavior normal.     ED Results / Procedures / Treatments   Labs (all labs ordered are listed, but only abnormal results are displayed) Labs Reviewed - No data to display  EKG None  Radiology DG Chest 2 View  Result Date: 11/08/2019 CLINICAL DATA:  Positive PPD EXAM: CHEST - 2 VIEW COMPARISON:  None. FINDINGS: The heart size and mediastinal contours are within normal limits. Both lungs are clear. The visualized skeletal structures are unremarkable. IMPRESSION: No active cardiopulmonary disease. Electronically Signed   By: Fidela Salisbury MD   On: 11/08/2019 17:15    Procedures Procedures (including critical care time)  Medications Ordered in ED Medications - No data to display  ED Course  I have reviewed the triage vital signs and the nursing notes.  Pertinent labs & imaging results that were available during my care of the patient were reviewed by me and considered in my medical decision making (see chart for details).    MDM Rules/Calculators/A&P                          History provided by patient with additional history obtained from chart review.     Patient presenting after positive TB skin test at urgent care. She had BCG vaccine as a child in [place]. VSS. No systemic symptoms. Exam benign. Chest xray needed for work clearance.I viewed pt's chest xray and it does not suggest acute infectious processes. No findings to suggest TB.  The patient appears reasonably screened and/or stabilized for discharge and I doubt any other medical condition or other [facility] requiring further screening, evaluation, or treatment in the ED at this time prior to discharge. The  patient is safe for discharge with strict return precautions discussed.   Portions of this note were generated with [employer]. Dictation errors may occur  despite best attempts at proofreading.    Final Clinical Impression(s) / ED Diagnoses Final diagnoses:  History of TB skin testing    Rx / DC Orders ED Discharge Orders    None       Flint Melter 11/08/19 1732    Isla Pence, MD 11/09/19 [phone]

## 2019-11-08 NOTE — Discharge Instructions (Addendum)
The report from your chest xray today is: CLINICAL DATA:  Positive PPD     EXAM:  CHEST - 2 VIEW     COMPARISON:  None.     FINDINGS:  The heart size and mediastinal contours are within normal limits.  Both lungs are clear. The visualized skeletal structures are  unremarkable.     IMPRESSION:  No active cardiopulmonary disease.     If you need an official copy for work you will need to contact medical records. You can also see xray results if you login to your MyChart.

## 2020-01-21 ENCOUNTER — Emergency Department (HOSPITAL_BASED_OUTPATIENT_CLINIC_OR_DEPARTMENT_OTHER)
Admission: EM | Admit: 2020-01-21 | Discharge: 2020-01-21 | Disposition: A | Discharge status: Home / Self Care | Payer: Medicaid Other | Attending: Emergency Medicine | Admitting: Emergency Medicine

## 2020-01-21 ENCOUNTER — Other Ambulatory Visit: Payer: Self-pay

## 2020-01-21 ENCOUNTER — Encounter (HOSPITAL_BASED_OUTPATIENT_CLINIC_OR_DEPARTMENT_OTHER): Payer: Self-pay | Admitting: Emergency Medicine

## 2020-01-21 DIAGNOSIS — M5432 Sciatica, left side: Secondary | ICD-10-CM | POA: Insufficient documentation

## 2020-01-21 MED ORDER — PREDNISONE 50 MG PO TABS
60.0000 mg | ORAL_TABLET | Freq: Once | ORAL | Status: AC
  Administered 2020-01-21: 60 mg via ORAL
  Filled 2020-01-21: qty 1

## 2020-01-21 MED ORDER — METHYLPREDNISOLONE ACETATE 40 MG/ML IJ SUSP
40.0000 mg | Freq: Once | INTRAMUSCULAR | Status: DC
  Filled 2020-01-21: qty 1

## 2020-01-21 MED ORDER — KETOROLAC TROMETHAMINE 60 MG/2ML IM SOLN
30.0000 mg | Freq: Once | INTRAMUSCULAR | Status: AC
  Administered 2020-01-21: 30 mg via INTRAMUSCULAR
  Filled 2020-01-21: qty 2

## 2020-01-21 MED ORDER — CYCLOBENZAPRINE HCL 10 MG PO TABS
10.0000 mg | ORAL_TABLET | Freq: Once | ORAL | Status: AC
  Administered 2020-01-21: 10 mg via ORAL
  Filled 2020-01-21: qty 1

## 2020-01-21 MED ORDER — ORPHENADRINE CITRATE ER 100 MG PO TB12: 100.0000 mg | ORAL_TABLET | Freq: Two times a day (BID) | ORAL | 0 refills | Status: DC

## 2020-01-21 MED ORDER — PREDNISONE 10 MG (21) PO TBPK: ORAL_TABLET | Freq: Every day | ORAL | 0 refills | Status: DC

## 2020-01-21 MED ORDER — MELOXICAM 7.5 MG PO TABS: 7.5000 mg | ORAL_TABLET | Freq: Every day | ORAL | 0 refills | Status: AC

## 2020-01-21 NOTE — ED Notes (Signed)
Pt denies injury, denies fever, tearful when assessed. 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

## 2020-01-21 NOTE — ED Notes (Signed)
ED Provider at bedside. 

## 2020-01-21 NOTE — ED Provider Notes (Signed)
[facility] EMERGENCY DEPARTMENT Provider Note   CSN: [id number] Arrival date & time: 01/21/20  1034     History Chief Complaint  Patient presents with  . Back Pain    Rose Edwards is a 34 y.o. female.  34 year old female presents with complaint of low back pain. Reports this to be a reoccurring problem, started 2 days ago without fall or injury. Pain radiates down left leg, reports numbness to lateral thighs (bilateral), pain is worse with coughing and sneezing, reports an electric type pain in her left SI joint, does not radiate down the leg.  Denies abdominal pain, changes in bowel or bladder habits, groin numbness.  No other complaints or concerns.  Patient states that she has been to a spine specialist who has done injections in her back and was advised to come back if she had worsening pain that she may need other injections versus an MRI.  Patient also states several months ago while in [place] she was treated by a provider and had an MRI at that time, has also been to a Restaurant manager, fast food.        History reviewed. No pertinent past medical history.  Patient Active Problem List   Diagnosis Date Noted  . Normal labor 04/17/2017  . Indication for care in labor and delivery, antepartum 04/16/2017  . 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 Uterine fibroid in pregnancy 09/22/2016  . Supervision of high risk pregnancy, antepartum 09/08/2016  . History of cesarean section, unknown scar 09/08/2016    Past Surgical History:  Procedure Laterality Date  . CESAREAN SECTION  2012   [place]  . CESAREAN SECTION N/A 04/17/2017   Procedure: CESAREAN SECTION;  Surgeon: Will Bonnet, MD;  Location: [facility] ORS;  Service: Obstetrics;  Laterality: N/A;     OB History    Gravida  3   Para  2   Term  2   Preterm      AB  1   Living  2     SAB  1   TAB      Ectopic      Multiple  0   Live Births  2           Family History  Problem Relation Age of Onset    . Hypertension Mother     Social History   Tobacco Use  . Smoking status: Never Smoker  . Smokeless tobacco: Never Used  Substance Use Topics  . Alcohol use: No  . Drug use: No    Home Medications Prior to Admission medications   Medication Sig Start Date End Date Taking? Authorizing Provider  acetaminophen (TYLENOL) 325 MG tablet Take 650 mg by mouth daily as needed for moderate pain or headache.    [provider]  levonorgestrel (MIRENA) 20 MCG/24HR IUD 1 Intra Uterine Device (1 each total) by Intrauterine route once for 1 dose. 06/10/17 06/10/17  Will Bonnet, MD  meloxicam (MOBIC) 7.5 MG tablet Take 1 tablet (7.5 mg total) by mouth daily for 10 days. 01/21/20 01/31/20  Tacy Learn, PA-C  orphenadrine (NORFLEX) 100 MG tablet Take 1 tablet (100 mg total) by mouth 2 (two) times daily. 01/21/20   Tacy Learn, PA-C  predniSONE (STERAPRED UNI-PAK 21 TAB) 10 MG (21) TBPK tablet Take by mouth daily. Take 6 tabs by mouth daily  for 2 days, then 5 tabs for 2 days, then 4 tabs for 2 days, then 3 tabs for 2  days, 2 tabs for 2 days, then 1 tab by mouth daily for 2 days 01/21/20   Tacy Learn, PA-C  Prenatal Vit-Fe Fumarate-FA (PRENATAL PO) Take 1 tablet by mouth daily.    [provider]    Allergies    Patient has no known allergies.  Review of Systems   Review of Systems  Constitutional: Negative for fever.  Gastrointestinal: Negative for abdominal pain, constipation and diarrhea.  Musculoskeletal: Positive for back pain.  Skin: Negative for rash and wound.  Allergic/Immunologic: Negative for immunocompromised state.  Neurological: Negative for weakness and numbness.  All other systems reviewed and are negative.   Physical Exam Updated Vital Signs BP 104/74 (BP Location: Right Arm)   Pulse 89   Temp 98.1 F (36.7 C) (Oral)   Resp 18   Ht 5\' 1"  (1.549 m)   Wt 49 kg   SpO2 100%   BMI 20.41 kg/m   Physical Exam Vitals and nursing note  reviewed.  Constitutional:      General: She is not in acute distress.    Appearance: She is well-developed. She is not diaphoretic.  HENT:     Head: Normocephalic and atraumatic.  Cardiovascular:     Pulses: Normal pulses.  Pulmonary:     Effort: Pulmonary effort is normal.  Abdominal:     Palpations: Abdomen is soft.     Tenderness: There is no abdominal tenderness.  Musculoskeletal:        General: Tenderness present.     Right lower leg: No edema.     Left lower leg: No edema.     Comments: Tenderness palpation left SI joint and left paraspinous, no midline or bony tenderness.  Straight leg raise positive with extension of each leg.  Reflexes symmetric, leg strength symmetric.  Skin:    General: Skin is warm and dry.     Findings: No erythema or rash.  Neurological:     Mental Status: She is alert and oriented to person, place, and time.     Sensory: No sensory deficit.     Motor: No weakness.     Deep Tendon Reflexes: Reflexes normal.  Psychiatric:        Behavior: Behavior normal.     ED Results / Procedures / Treatments   Labs (all labs ordered are listed, but only abnormal results are displayed) Labs Reviewed - No data to display  EKG None  Radiology No results found.  Procedures Procedures (including critical care time)  Medications Ordered in ED Medications  ketorolac (TORADOL) injection 30 mg (30 mg Intramuscular Given 01/21/20 1208)  cyclobenzaprine (FLEXERIL) tablet 10 mg (10 mg Oral Given 01/21/20 1206)  predniSONE (DELTASONE) tablet 60 mg (60 mg Oral Given 01/21/20 1206)    ED Course  I have reviewed the triage vital signs and the nursing notes.  Pertinent labs & imaging results that were available during my care of the patient were reviewed by me and considered in my medical decision making (see chart for details).  Clinical Course as of Jan 21 1211  Sun Jan 21, 3431  6032 34 year old female with complaint of low back pain, acute on chronic  problem, no new injuries, no changes in her chronic pain pattern.  Plan is to give injection of Toradol here, Flexeril and prednisone.  Will discharge with prednisone taper, Norflex and meloxicam.  Recommend warm compresses for 20 minutes followed by gentle stretching and follow-up with your spine specialist.   [LM]    Clinical  Course User Index [LM] Roque Lias   MDM Rules/Calculators/A&P                          Final Clinical Impression(s) / ED Diagnoses Final diagnoses:  Sciatica of left side    Rx / DC Orders ED Discharge Orders         Ordered    predniSONE (STERAPRED UNI-PAK 21 TAB) 10 MG (21) TBPK tablet  Daily        01/21/20 1159    orphenadrine (NORFLEX) 100 MG tablet  2 times daily        01/21/20 1159    meloxicam (MOBIC) 7.5 MG tablet  Daily        01/21/20 1159           Roque Lias 01/21/20 1212    Wyvonnia Dusky, MD 01/21/20 1732

## 2020-01-21 NOTE — Discharge Instructions (Signed)
Follow-up with your back specialist.  Return to ER for worsening or concerning symptoms. Take meloxicam and Norflex as needed as prescribed.  Take prednisone as prescribed and complete the full course.  Apply warm compresses to your low back for 20 minutes followed by gentle stretching.

## 2020-01-21 NOTE — ED Triage Notes (Signed)
Low back pain radiating down L leg. Hx of sciatica.

## 2021-04-22 IMAGING — CR DG CHEST 2V
2 series · 2 of 2 positions shown · non-contrast
Comparison: None.

CLINICAL DATA: Positive PPD

EXAM:
CHEST - 2 VIEW

[w chest pa]
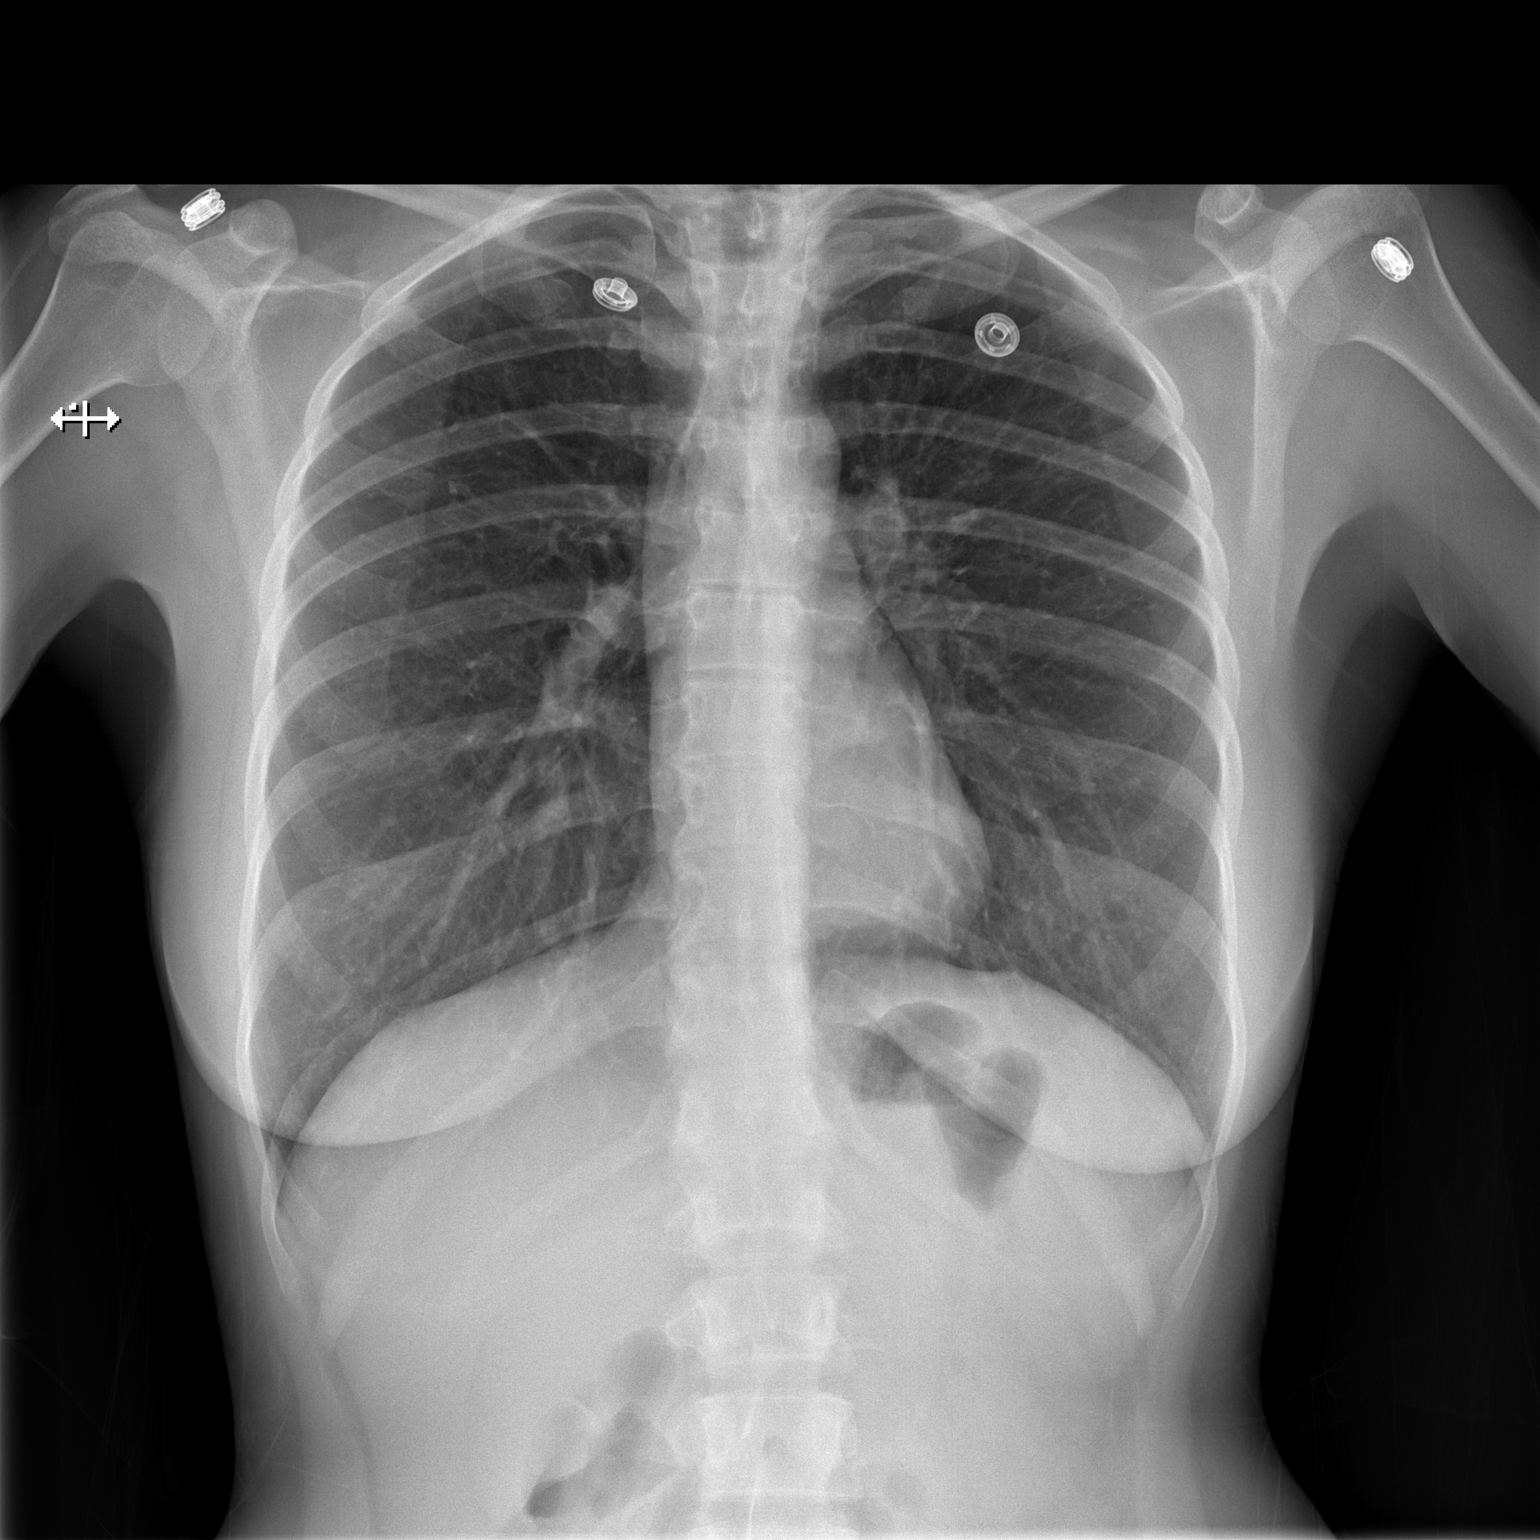

[w chest lat]
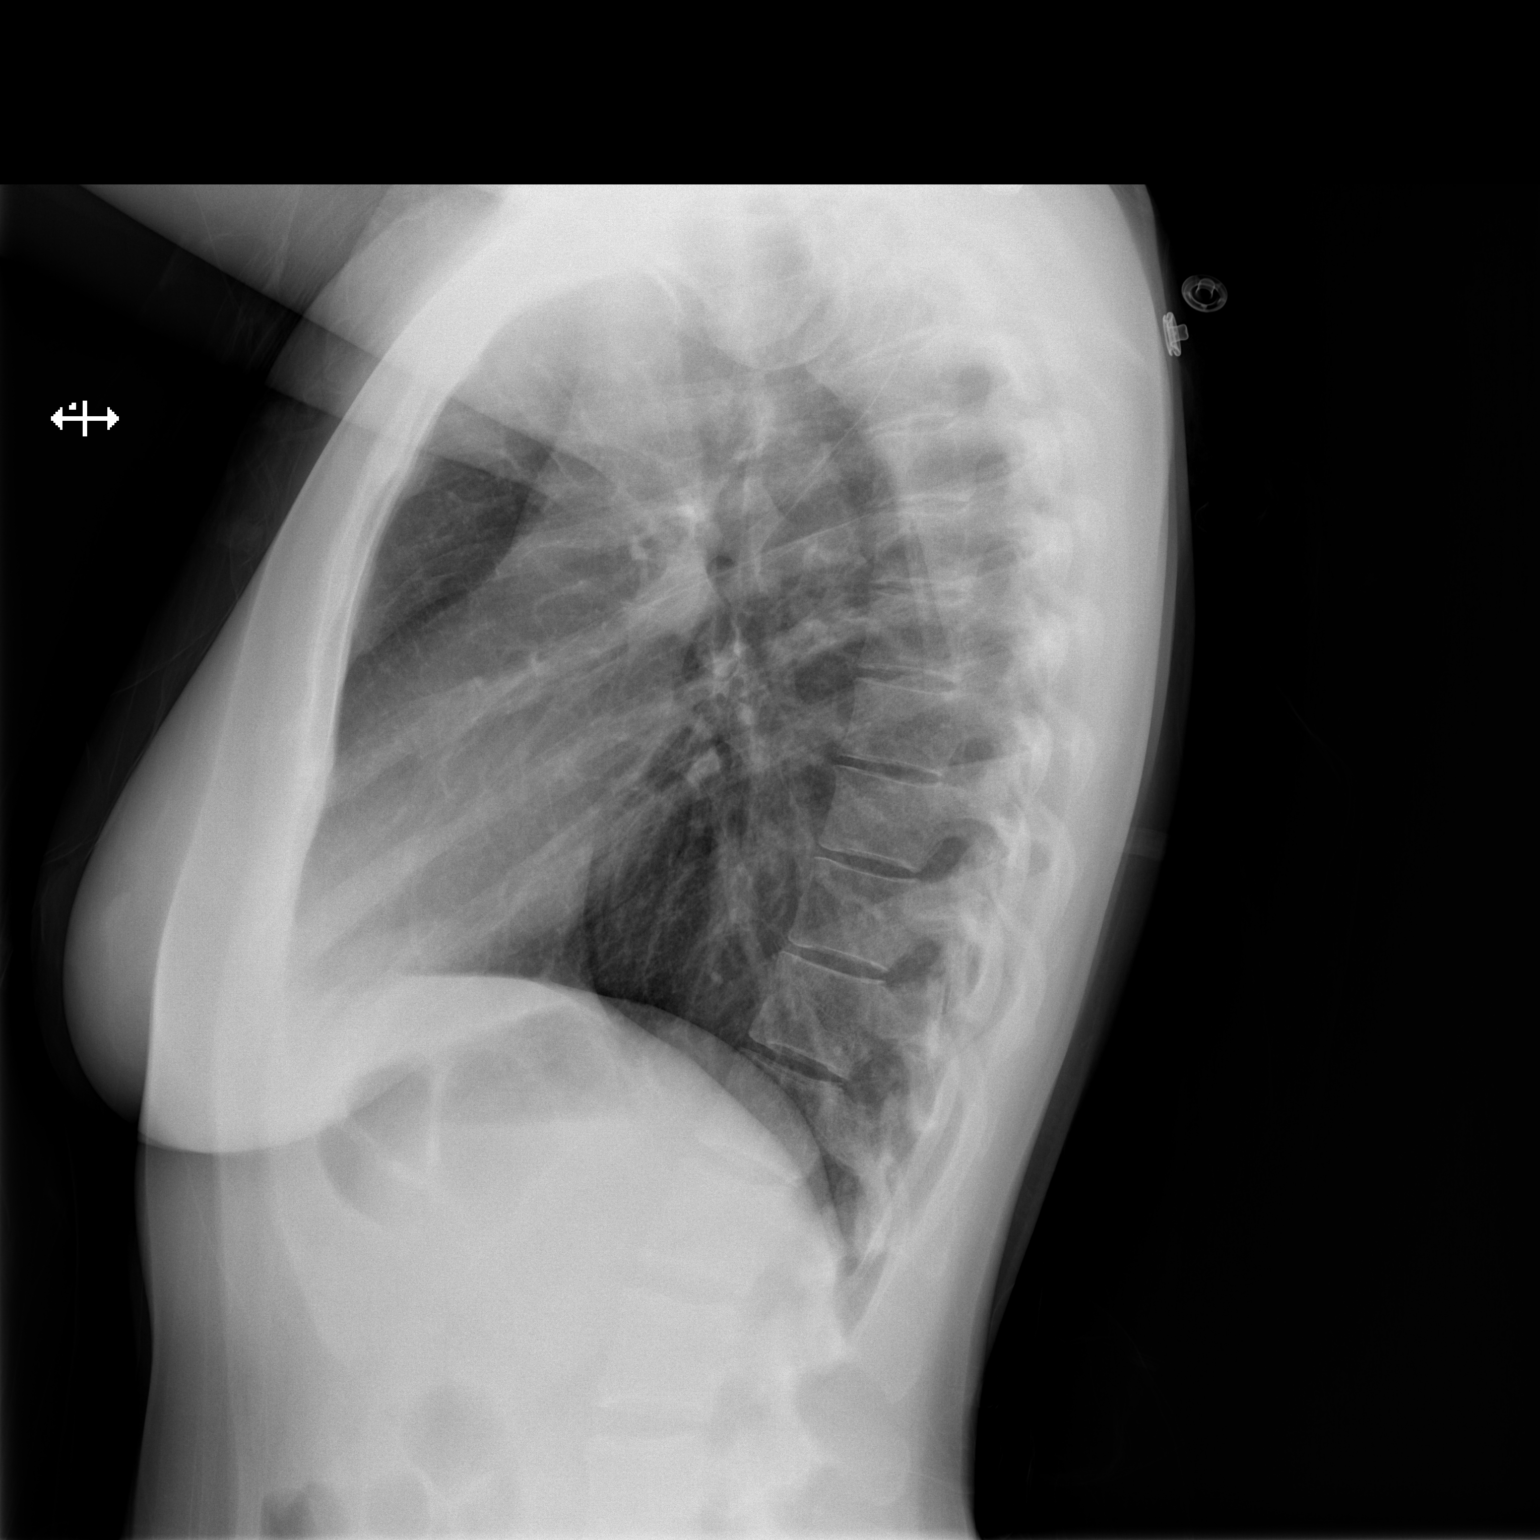

[2 of 2 positions shown; findings below may reference images not displayed]

FINDINGS: The heart size and mediastinal contours are within normal limits.
Both lungs are clear. The visualized skeletal structures are
unremarkable.
IMPRESSION: No active cardiopulmonary disease.

## 2022-04-30 ENCOUNTER — Encounter: Payer: Medicaid Other | Admitting: Obstetrics and Gynecology

## 2022-05-15 ENCOUNTER — Other Ambulatory Visit: Payer: Self-pay | Admitting: Obstetrics & Gynecology

## 2022-05-28 ENCOUNTER — Other Ambulatory Visit (HOSPITAL_COMMUNITY): Payer: Self-pay | Admitting: Obstetrics and Gynecology

## 2022-05-28 DIAGNOSIS — Z30430 Encounter for insertion of intrauterine contraceptive device: Secondary | ICD-10-CM

## 2022-07-09 ENCOUNTER — Other Ambulatory Visit: Payer: Self-pay | Admitting: Obstetrics & Gynecology

## 2022-07-10 NOTE — Pre-Procedure Instructions (Signed)
Surgical Instructions    Your procedure is scheduled on Jul 20, 2022.  Report to [facility] Main Entrance "A" at 8:30 A.M., then check in with the Admitting office.  Call this number if you have problems the morning of surgery:  [phone]  If you have any questions prior to your surgery date call [phone]: Open Monday-Friday 8am-4pm If you experience any cold or flu symptoms such as cough, fever, chills, shortness of breath, etc. between now and your scheduled surgery, please notify us at the above number.     Remember:  Do not eat after midnight the night before your surgery  You may drink clear liquids until 7:30 AM the morning of your surgery.   Clear liquids allowed are: Water, Non-Citrus Juices (without pulp), Carbonated Beverages, Clear Tea, Black Coffee Only (NO MILK, CREAM OR POWDERED CREAMER of any kind), and Gatorade.     Take these medicines the morning of surgery with A SIP OF WATER:  acetaminophen (TYLENOL) - may take if needed  Naphazoline-Pheniramine (ALLERGY EYE OP) - may take if needed   As of today, STOP taking any Aspirin (unless otherwise instructed by your surgeon) Aleve, Naproxen, Ibuprofen, Motrin, Advil, Goody's, BC's, all herbal medications, fish oil, and all vitamins.                     Do NOT Smoke (Tobacco/Vaping) for 24 hours prior to your procedure.  If you use a CPAP at night, you may bring your mask/headgear for your overnight stay.   Contacts, glasses, piercing's, hearing aid's, dentures or partials may not be worn into surgery, please bring cases for these belongings.    For patients admitted to the hospital, discharge time will be determined by your treatment team.   Patients discharged the day of surgery will not be allowed to drive home, and someone needs to stay with them for 24 hours.  SURGICAL WAITING ROOM VISITATION Patients having surgery or a procedure may have no more than 2 support people in the waiting area - these visitors  may rotate.   Children under the age of 50 must have an adult with them who is not the patient. If the patient needs to stay at the hospital during part of their recovery, the visitor guidelines for inpatient rooms apply. Pre-op nurse will coordinate an appropriate time for 1 support person to accompany patient in pre-op.  This support person may not rotate.   Please refer to the [facility] website for the visitor guidelines for Inpatients (after your surgery is over and you are in a regular room).    Special instructions:   [place]- Preparing For Surgery  Before surgery, you can play an important role. Because skin is not sterile, your skin needs to be as free of germs as possible. You can reduce the number of germs on your skin by washing with CHG (chlorahexidine gluconate) Soap before surgery.  CHG is an antiseptic cleaner which kills germs and bonds with the skin to continue killing germs even after washing.    Oral Hygiene is also important to reduce your risk of infection.  Remember - BRUSH YOUR TEETH THE MORNING OF SURGERY WITH YOUR REGULAR TOOTHPASTE  Please do not use if you have an allergy to CHG or antibacterial soaps. If your skin becomes reddened/irritated stop using the CHG.  Do not shave (including legs and underarms) for at least 48 hours prior to first CHG shower. It is OK to shave your face.  Please follow these instructions carefully.   Shower the NIGHT BEFORE SURGERY and the MORNING OF SURGERY  If you chose to wash your hair, wash your hair first as usual with your normal shampoo.  After you shampoo, rinse your hair and body thoroughly to remove the shampoo.  Use CHG Soap as you would any other liquid soap. You can apply CHG directly to the skin and wash gently with a scrungie or a clean washcloth.   Apply the CHG Soap to your body ONLY FROM THE NECK DOWN.  Do not use on open wounds or open sores. Avoid contact with your eyes, ears, mouth and genitals (private  parts). Wash Face and genitals (private parts)  with your normal soap.   Wash thoroughly, paying special attention to the area where your surgery will be performed.  Thoroughly rinse your body with warm water from the neck down.  DO NOT shower/wash with your normal soap after using and rinsing off the CHG Soap.  Pat yourself dry with a CLEAN TOWEL.  Wear CLEAN PAJAMAS to bed the night before surgery  Place CLEAN SHEETS on your bed the night before your surgery  DO NOT SLEEP WITH PETS.   Day of Surgery: Take a shower with CHG soap. Do not wear jewelry or makeup Do not wear lotions, powders, perfumes/colognes, or deodorant. Do not shave 48 hours prior to surgery.  Men may shave face and neck. Do not bring valuables to the hospital.  [facility] is not responsible for any belongings or valuables. Do not wear nail polish, gel polish, artificial nails, or any other type of covering on natural nails (fingers and toes) If you have artificial nails or gel coating that need to be removed by a nail salon, please have this removed prior to surgery. Artificial nails or gel coating may interfere with anesthesia's ability to adequately monitor your vital signs.  Wear Clean/Comfortable clothing the morning of surgery Remember to brush your teeth WITH YOUR REGULAR TOOTHPASTE.   Please read over the following fact sheets that you were given.    If you received a COVID test during your pre-op visit  it is requested that you wear a mask when out in public, stay away from anyone that may not be feeling well and notify your surgeon if you develop symptoms. If you have been in contact with anyone that has tested positive in the last 10 days please notify you surgeon.

## 2022-07-13 ENCOUNTER — Other Ambulatory Visit: Payer: Self-pay

## 2022-07-13 ENCOUNTER — Encounter (HOSPITAL_COMMUNITY)
Admission: RE | Admit: 2022-07-13 | Discharge: 2022-07-13 | Disposition: A | Discharge status: Home / Self Care | Payer: 59 | Source: Ambulatory Visit | Attending: Obstetrics & Gynecology | Admitting: Obstetrics & Gynecology

## 2022-07-13 ENCOUNTER — Encounter (HOSPITAL_COMMUNITY): Payer: Self-pay

## 2022-07-13 VITALS — BP 112/73 | HR 86 | Temp 98.0°F | Resp 18 | Ht 61.0 in | Wt 111.0 lb

## 2022-07-13 DIAGNOSIS — Z01812 Encounter for preprocedural laboratory examination: Secondary | ICD-10-CM | POA: Insufficient documentation

## 2022-07-13 DIAGNOSIS — Z01818 Encounter for other preprocedural examination: Secondary | ICD-10-CM

## 2022-07-13 HISTORY — DX: Other specified health status: Z78.9

## 2022-07-13 LAB — CBC
HCT: 41.8 % (ref 36.0–46.0)
Hemoglobin: 13.6 g/dL (ref 12.0–15.0)
MCH: 26 pg (ref 26.0–34.0)
MCHC: 32.5 g/dL (ref 30.0–36.0)
MCV: 79.8 fL — ABNORMAL LOW (ref 80.0–100.0)
Platelets: 278 10*3/uL (ref 150–400)
RBC: 5.24 MIL/uL — ABNORMAL HIGH (ref 3.87–5.11)
RDW: 13.7 % (ref 11.5–15.5)
WBC: 4 10*3/uL (ref 4.0–10.5)
nRBC: 0 % (ref 0.0–0.2)

## 2022-07-13 NOTE — Progress Notes (Signed)
PCP - No PCR Cardiologist - Denies  PPM/ICD - Denies Device Orders - n/a Rep Notified - n/a  Chest x-ray - n/a EKG - Denies Stress Test - Denies ECHO -Denies  Cardiac Cath - Denies  Sleep Study - Denies CPAP - n/a  No DM  Last dose of GLP1 agonist- n/a GLP1 instructions: n/a  Blood Thinner Instructions: n/a Aspirin Instructions: Pt instructed to stop taking ASA starting today. She can start it again after surgery.  ERAS Protcol - Clear liquids until 0820 morning of surgery PRE-SURGERY Ensure or G2- n/a  COVID TEST- n/a   Anesthesia review: No.   Patient denies shortness of breath, fever, cough and chest pain at PAT appointment. Pt denies any respiratory illness/infection.   All instructions explained to the patient, with a verbal understanding of the material. Patient agrees to go over the instructions while at home for a better understanding. Patient also instructed to self quarantine after being tested for COVID-19. The opportunity to ask questions was provided.

## 2022-07-17 NOTE — Progress Notes (Signed)
Patient was called to informed that the surgery time for Monday was changed to 11:50 o'clock. Patient was instructed to be at the hospital at 09:50 o'clock and stop drinking clear liquids at 08:50 o'clock. Patient verbalized understanding.

## 2022-07-20 ENCOUNTER — Inpatient Hospital Stay (HOSPITAL_COMMUNITY): Payer: 59 | Admitting: Anesthesiology

## 2022-07-20 ENCOUNTER — Inpatient Hospital Stay (HOSPITAL_COMMUNITY)
Admission: RE | Admit: 2022-07-20 | Discharge: 2022-07-21 | DRG: 743 | Disposition: A | Discharge status: Home / Self Care | Payer: 59 | Attending: Obstetrics & Gynecology | Admitting: Obstetrics & Gynecology

## 2022-07-20 ENCOUNTER — Ambulatory Visit (HOSPITAL_COMMUNITY)
Admission: RE | Admit: 2022-07-20 | Discharge: 2022-07-20 | Disposition: A | Discharge status: Home / Self Care | Payer: 59 | Source: Ambulatory Visit | Attending: Obstetrics and Gynecology | Admitting: Obstetrics and Gynecology

## 2022-07-20 ENCOUNTER — Other Ambulatory Visit: Payer: Self-pay

## 2022-07-20 ENCOUNTER — Encounter (HOSPITAL_COMMUNITY): Payer: Self-pay | Admitting: Obstetrics & Gynecology

## 2022-07-20 ENCOUNTER — Encounter (HOSPITAL_COMMUNITY)
Admission: RE | Disposition: A | Discharge status: Home / Self Care | Payer: Self-pay | Source: Home / Self Care | Attending: Obstetrics & Gynecology

## 2022-07-20 ENCOUNTER — Encounter (HOSPITAL_COMMUNITY): Payer: Self-pay

## 2022-07-20 DIAGNOSIS — Z7982 Long term (current) use of aspirin: Secondary | ICD-10-CM

## 2022-07-20 DIAGNOSIS — N938 Other specified abnormal uterine and vaginal bleeding: Secondary | ICD-10-CM | POA: Diagnosis present

## 2022-07-20 DIAGNOSIS — Z975 Presence of (intrauterine) contraceptive device: Secondary | ICD-10-CM | POA: Diagnosis not present

## 2022-07-20 DIAGNOSIS — Z79899 Other long term (current) drug therapy: Secondary | ICD-10-CM | POA: Diagnosis not present

## 2022-07-20 DIAGNOSIS — Z9889 Other specified postprocedural states: Principal | ICD-10-CM

## 2022-07-20 DIAGNOSIS — D250 Submucous leiomyoma of uterus: Secondary | ICD-10-CM | POA: Diagnosis not present

## 2022-07-20 DIAGNOSIS — Z30430 Encounter for insertion of intrauterine contraceptive device: Secondary | ICD-10-CM

## 2022-07-20 DIAGNOSIS — D251 Intramural leiomyoma of uterus: Secondary | ICD-10-CM | POA: Diagnosis not present

## 2022-07-20 DIAGNOSIS — D252 Subserosal leiomyoma of uterus: Secondary | ICD-10-CM

## 2022-07-20 DIAGNOSIS — D259 Leiomyoma of uterus, unspecified: Secondary | ICD-10-CM | POA: Diagnosis present

## 2022-07-20 DIAGNOSIS — Z791 Long term (current) use of non-steroidal anti-inflammatories (NSAID): Secondary | ICD-10-CM | POA: Diagnosis not present

## 2022-07-20 HISTORY — PX: INTRAUTERINE DEVICE (IUD) INSERTION: SHX5877

## 2022-07-20 HISTORY — PX: MYOMECTOMY: SHX85

## 2022-07-20 LAB — CBC
HCT: 31.4 % — ABNORMAL LOW (ref 36.0–46.0)
Hemoglobin: 9.8 g/dL — ABNORMAL LOW (ref 12.0–15.0)
MCH: 25.8 pg — ABNORMAL LOW (ref 26.0–34.0)
MCHC: 31.2 g/dL (ref 30.0–36.0)
MCV: 82.6 fL (ref 80.0–100.0)
Platelets: 201 10*3/uL (ref 150–400)
RBC: 3.8 MIL/uL — ABNORMAL LOW (ref 3.87–5.11)
RDW: 13.5 % (ref 11.5–15.5)
WBC: 11.3 10*3/uL — ABNORMAL HIGH (ref 4.0–10.5)
nRBC: 0 % (ref 0.0–0.2)

## 2022-07-20 LAB — POCT I-STAT, CHEM 8
BUN: 8 mg/dL (ref 6–20)
Calcium, Ion: 1.15 mmol/L (ref 1.15–1.40)
Chloride: 104 mmol/L (ref 98–111)
Creatinine, Ser: 0.6 mg/dL (ref 0.44–1.00)
Glucose, Bld: 134 mg/dL — ABNORMAL HIGH (ref 70–99)
HCT: 29 % — ABNORMAL LOW (ref 36.0–46.0)
Hemoglobin: 9.9 g/dL — ABNORMAL LOW (ref 12.0–15.0)
Potassium: 3.2 mmol/L — ABNORMAL LOW (ref 3.5–5.1)
Sodium: 141 mmol/L (ref 135–145)
TCO2: 24 mmol/L (ref 22–32)

## 2022-07-20 LAB — POCT PREGNANCY, URINE: Preg Test, Ur: NEGATIVE

## 2022-07-20 SURGERY — MYOMECTOMY, ABDOMINAL APPROACH
Anesthesia: General

## 2022-07-20 MED ORDER — OXYCODONE HCL 5 MG PO TABS
5.0000 mg | ORAL_TABLET | ORAL | Status: DC | PRN
  Administered 2022-07-21: 10 mg via ORAL
  Filled 2022-07-20: qty 2

## 2022-07-20 MED ORDER — LACTATED RINGERS IV SOLN: INTRAVENOUS | Status: DC

## 2022-07-20 MED ORDER — MIDAZOLAM HCL 2 MG/2ML IJ SOLN
INTRAMUSCULAR | Status: AC
  Filled 2022-07-20: qty 2

## 2022-07-20 MED ORDER — CEFAZOLIN SODIUM-DEXTROSE 2-4 GM/100ML-% IV SOLN
INTRAVENOUS | Status: AC
  Filled 2022-07-20: qty 100

## 2022-07-20 MED ORDER — SUGAMMADEX SODIUM 200 MG/2ML IV SOLN
INTRAVENOUS | Status: DC | PRN
  Administered 2022-07-20: 200 mg via INTRAVENOUS

## 2022-07-20 MED ORDER — AMISULPRIDE (ANTIEMETIC) 5 MG/2ML IV SOLN
10.0000 mg | Freq: Once | INTRAVENOUS | Status: AC | PRN
  Administered 2022-07-20: 10 mg via INTRAVENOUS

## 2022-07-20 MED ORDER — 0.9 % SODIUM CHLORIDE (POUR BTL) OPTIME
TOPICAL | Status: DC | PRN
  Administered 2022-07-20: 2000 mL

## 2022-07-20 MED ORDER — HYDROMORPHONE HCL 1 MG/ML IJ SOLN: 0.2000 mg | INTRAMUSCULAR | Status: DC | PRN

## 2022-07-20 MED ORDER — LACTATED RINGERS IV SOLN: INTRAVENOUS | Status: DC | PRN

## 2022-07-20 MED ORDER — CELECOXIB 200 MG PO CAPS
200.0000 mg | ORAL_CAPSULE | Freq: Once | ORAL | Status: AC
  Administered 2022-07-20: 200 mg via ORAL
  Filled 2022-07-20: qty 1

## 2022-07-20 MED ORDER — DEXAMETHASONE SODIUM PHOSPHATE 10 MG/ML IJ SOLN
INTRAMUSCULAR | Status: DC | PRN
  Administered 2022-07-20: 10 mg via INTRAVENOUS

## 2022-07-20 MED ORDER — PANTOPRAZOLE SODIUM 40 MG PO TBEC
40.0000 mg | DELAYED_RELEASE_TABLET | Freq: Every day | ORAL | Status: DC
  Administered 2022-07-20 – 2022-07-21 (×2): 40 mg via ORAL
  Filled 2022-07-20 (×2): qty 1

## 2022-07-20 MED ORDER — MENTHOL 3 MG MT LOZG: 1.0000 | LOZENGE | OROMUCOSAL | Status: DC | PRN

## 2022-07-20 MED ORDER — FENTANYL CITRATE (PF) 100 MCG/2ML IJ SOLN
INTRAMUSCULAR | Status: AC
  Filled 2022-07-20: qty 2

## 2022-07-20 MED ORDER — ONDANSETRON HCL 4 MG/2ML IJ SOLN: 4.0000 mg | Freq: Four times a day (QID) | INTRAMUSCULAR | Status: DC | PRN

## 2022-07-20 MED ORDER — POLYETHYLENE GLYCOL 3350 17 G PO PACK: 17.0000 g | PACK | Freq: Every day | ORAL | Status: DC | PRN

## 2022-07-20 MED ORDER — ONDANSETRON HCL 4 MG/2ML IJ SOLN
INTRAMUSCULAR | Status: DC | PRN
  Administered 2022-07-20: 4 mg via INTRAVENOUS

## 2022-07-20 MED ORDER — LIDOCAINE 2% (20 MG/ML) 5 ML SYRINGE
INTRAMUSCULAR | Status: AC
  Filled 2022-07-20: qty 5

## 2022-07-20 MED ORDER — TRANEXAMIC ACID-NACL 1000-0.7 MG/100ML-% IV SOLN
INTRAVENOUS | Status: AC
  Filled 2022-07-20: qty 100

## 2022-07-20 MED ORDER — FENTANYL CITRATE (PF) 250 MCG/5ML IJ SOLN
INTRAMUSCULAR | Status: DC | PRN
  Administered 2022-07-20: 100 ug via INTRAVENOUS
  Administered 2022-07-20 (×2): 50 ug via INTRAVENOUS

## 2022-07-20 MED ORDER — IBUPROFEN 600 MG PO TABS: 600.0000 mg | ORAL_TABLET | Freq: Four times a day (QID) | ORAL | Status: DC

## 2022-07-20 MED ORDER — VASOPRESSIN 20 UNIT/ML IV SOLN
INTRAVENOUS | Status: DC | PRN
  Administered 2022-07-20: 50 mL via INTRAMUSCULAR

## 2022-07-20 MED ORDER — DOCUSATE SODIUM 100 MG PO CAPS
100.0000 mg | ORAL_CAPSULE | Freq: Two times a day (BID) | ORAL | Status: DC
  Administered 2022-07-20 – 2022-07-21 (×2): 100 mg via ORAL
  Filled 2022-07-20 (×2): qty 1

## 2022-07-20 MED ORDER — ONDANSETRON HCL 4 MG PO TABS: 4.0000 mg | ORAL_TABLET | Freq: Four times a day (QID) | ORAL | Status: DC | PRN

## 2022-07-20 MED ORDER — FENTANYL CITRATE (PF) 100 MCG/2ML IJ SOLN
25.0000 ug | INTRAMUSCULAR | Status: DC | PRN
  Administered 2022-07-20: 25 ug via INTRAVENOUS

## 2022-07-20 MED ORDER — TRANEXAMIC ACID-NACL 1000-0.7 MG/100ML-% IV SOLN
1000.0000 mg | INTRAVENOUS | Status: AC
  Administered 2022-07-20: 1000 mg via INTRAVENOUS

## 2022-07-20 MED ORDER — PHENYLEPHRINE 80 MCG/ML (10ML) SYRINGE FOR IV PUSH (FOR BLOOD PRESSURE SUPPORT)
PREFILLED_SYRINGE | INTRAVENOUS | Status: DC | PRN
  Administered 2022-07-20 (×2): 80 ug via INTRAVENOUS

## 2022-07-20 MED ORDER — KETOROLAC TROMETHAMINE 30 MG/ML IJ SOLN
30.0000 mg | Freq: Four times a day (QID) | INTRAMUSCULAR | Status: AC
  Administered 2022-07-20 – 2022-07-21 (×4): 30 mg via INTRAVENOUS
  Filled 2022-07-20 (×4): qty 1

## 2022-07-20 MED ORDER — FENTANYL CITRATE (PF) 250 MCG/5ML IJ SOLN
INTRAMUSCULAR | Status: AC
  Filled 2022-07-20: qty 5

## 2022-07-20 MED ORDER — BISACODYL 5 MG PO TBEC: 5.0000 mg | DELAYED_RELEASE_TABLET | Freq: Every day | ORAL | Status: DC | PRN

## 2022-07-20 MED ORDER — MAGNESIUM CITRATE PO SOLN: 1.0000 | Freq: Once | ORAL | Status: DC | PRN

## 2022-07-20 MED ORDER — SILVER NITRATE-POT NITRATE 75-25 % EX MISC
CUTANEOUS | Status: AC
  Filled 2022-07-20: qty 10

## 2022-07-20 MED ORDER — LIDOCAINE 2% (20 MG/ML) 5 ML SYRINGE
INTRAMUSCULAR | Status: DC | PRN
  Administered 2022-07-20: 40 mg via INTRAVENOUS

## 2022-07-20 MED ORDER — ROCURONIUM BROMIDE 10 MG/ML (PF) SYRINGE
PREFILLED_SYRINGE | INTRAVENOUS | Status: DC | PRN
  Administered 2022-07-20 (×2): 10 mg via INTRAVENOUS
  Administered 2022-07-20: 50 mg via INTRAVENOUS

## 2022-07-20 MED ORDER — ROCURONIUM BROMIDE 10 MG/ML (PF) SYRINGE
PREFILLED_SYRINGE | INTRAVENOUS | Status: AC
  Filled 2022-07-20: qty 10

## 2022-07-20 MED ORDER — ALBUMIN HUMAN 5 % IV SOLN: INTRAVENOUS | Status: DC | PRN

## 2022-07-20 MED ORDER — CEFAZOLIN SODIUM-DEXTROSE 2-3 GM-%(50ML) IV SOLR
INTRAVENOUS | Status: DC | PRN
  Administered 2022-07-20: 2 g via INTRAVENOUS

## 2022-07-20 MED ORDER — PROPOFOL 10 MG/ML IV BOLUS
INTRAVENOUS | Status: DC | PRN
  Administered 2022-07-20: 140 mg via INTRAVENOUS

## 2022-07-20 MED ORDER — MIDAZOLAM HCL 2 MG/2ML IJ SOLN
INTRAMUSCULAR | Status: DC | PRN
  Administered 2022-07-20: 2 mg via INTRAVENOUS

## 2022-07-20 MED ORDER — AMISULPRIDE (ANTIEMETIC) 5 MG/2ML IV SOLN
INTRAVENOUS | Status: AC
  Filled 2022-07-20: qty 4

## 2022-07-20 MED ORDER — CHLORHEXIDINE GLUCONATE 0.12 % MT SOLN
15.0000 mL | Freq: Once | OROMUCOSAL | Status: AC
  Administered 2022-07-20: 15 mL via OROMUCOSAL
  Filled 2022-07-20: qty 15

## 2022-07-20 MED ORDER — ORAL CARE MOUTH RINSE: 15.0000 mL | Freq: Once | OROMUCOSAL | Status: AC

## 2022-07-20 MED ORDER — ACETAMINOPHEN 500 MG PO TABS
1000.0000 mg | ORAL_TABLET | Freq: Once | ORAL | Status: AC
  Administered 2022-07-20: 1000 mg via ORAL
  Filled 2022-07-20: qty 2

## 2022-07-20 MED ORDER — ONDANSETRON HCL 4 MG/2ML IJ SOLN
INTRAMUSCULAR | Status: AC
  Filled 2022-07-20: qty 2

## 2022-07-20 MED ORDER — SIMETHICONE 80 MG PO CHEW: 80.0000 mg | CHEWABLE_TABLET | Freq: Four times a day (QID) | ORAL | Status: DC | PRN

## 2022-07-20 MED ORDER — ACETAMINOPHEN 500 MG PO TABS
1000.0000 mg | ORAL_TABLET | Freq: Four times a day (QID) | ORAL | Status: DC
  Administered 2022-07-20 – 2022-07-21 (×4): 1000 mg via ORAL
  Filled 2022-07-20 (×4): qty 2

## 2022-07-20 MED ORDER — LEVONORGESTREL 20 MCG/DAY IU IUD
1.0000 | INTRAUTERINE_SYSTEM | Freq: Once | INTRAUTERINE | Status: AC
  Administered 2022-07-20: 1 via INTRAUTERINE
  Filled 2022-07-20 (×2): qty 1

## 2022-07-20 MED ORDER — LIDOCAINE HCL (PF) 1 % IJ SOLN
INTRAMUSCULAR | Status: DC | PRN
  Administered 2022-07-20: 20 mL

## 2022-07-20 MED ORDER — PROPOFOL 10 MG/ML IV BOLUS
INTRAVENOUS | Status: AC
  Filled 2022-07-20: qty 20

## 2022-07-20 MED ORDER — DEXAMETHASONE SODIUM PHOSPHATE 10 MG/ML IJ SOLN
INTRAMUSCULAR | Status: AC
  Filled 2022-07-20: qty 1

## 2022-07-20 MED ORDER — LIDOCAINE HCL 1 % IJ SOLN
INTRAMUSCULAR | Status: AC
  Filled 2022-07-20: qty 20

## 2022-07-20 SURGICAL SUPPLY — 46 items
ADH SKN CLS APL DERMABOND .7 (GAUZE/BANDAGES/DRESSINGS) ×1
APL SKNCLS STERI-STRIP NONHPOA (GAUZE/BANDAGES/DRESSINGS) ×1
BARRIER ADHS 3X4 INTERCEED (GAUZE/BANDAGES/DRESSINGS) IMPLANT
BENZOIN TINCTURE PRP APPL 2/3 (GAUZE/BANDAGES/DRESSINGS) IMPLANT
BRR ADH 4X3 ABS CNTRL BYND (GAUZE/BANDAGES/DRESSINGS) ×1
DERMABOND ADVANCED .7 DNX12 (GAUZE/BANDAGES/DRESSINGS) ×1 IMPLANT
DRAIN PENROSE 0.25X18 (DRAIN) IMPLANT
DRAIN PENROSE 0.5X18 (DRAIN) IMPLANT
DRAPE WARM FLUID 44X44 (DRAPES) ×1 IMPLANT
DRSG OPSITE POSTOP 3X4 (GAUZE/BANDAGES/DRESSINGS) IMPLANT
DRSG OPSITE POSTOP 4X10 (GAUZE/BANDAGES/DRESSINGS) ×1 IMPLANT
DURAPREP 26ML APPLICATOR (WOUND CARE) ×1 IMPLANT
GAUZE 4X4 16PLY ~~LOC~~+RFID DBL (SPONGE) ×1 IMPLANT
GLOVE BIOGEL PI IND STRL 7.0 (GLOVE) ×2 IMPLANT
GLOVE ECLIPSE 7.0 STRL STRAW (GLOVE) ×1 IMPLANT
GLOVE SURG SS PI 6.5 STRL IVOR (GLOVE) ×1 IMPLANT
GOWN STRL REUS W/TWL LRG LVL3 (GOWN DISPOSABLE) ×1 IMPLANT
HOLDER FOLEY CATH W/STRAP (MISCELLANEOUS) ×1 IMPLANT
IRRIG SUCT STRYKERFLOW 2 WTIP (MISCELLANEOUS) ×1
IRRIGATION SUCT STRKRFLW 2 WTP (MISCELLANEOUS) ×1 IMPLANT
KIT TURNOVER CYSTO (KITS) ×1 IMPLANT
NDL HYPO 22X1.5 SAFETY MO (MISCELLANEOUS) ×1 IMPLANT
NEEDLE HYPO 22X1.5 SAFETY MO (MISCELLANEOUS) ×2 IMPLANT
PAD OB MATERNITY 4.3X12.25 (PERSONAL CARE ITEMS) ×1 IMPLANT
PENCIL SMOKE EVACUATOR (MISCELLANEOUS) ×1 IMPLANT
RTRCTR C-SECT PINK 25CM LRG (MISCELLANEOUS) IMPLANT
SHEET LAVH (DRAPES) ×1 IMPLANT
SLEEVE SCD COMPRESS KNEE MED (STOCKING) ×1 IMPLANT
SPONGE T-LAP 18X18 ~~LOC~~+RFID (SPONGE) ×1 IMPLANT
STRIP CLOSURE SKIN 1/2X4 (GAUZE/BANDAGES/DRESSINGS) IMPLANT
SUT CHROMIC 2 0 CT 1 (SUTURE) IMPLANT
SUT CHROMIC 3 0 SH 27 (SUTURE) IMPLANT
SUT MON AB 4-0 PS1 27 (SUTURE) ×1 IMPLANT
SUT PLAIN 2 0 XLH (SUTURE) IMPLANT
SUT PROLENE 2 0 CT 30 (SUTURE) IMPLANT
SUT VIC AB 0 CT1 27 (SUTURE) ×5
SUT VIC AB 0 CT1 27XBRD ANBCTR (SUTURE) ×4 IMPLANT
SUT VIC AB 0 CT1 36 (SUTURE) ×1 IMPLANT
SUT VIC AB 3-0 SH 27 (SUTURE) ×1
SUT VIC AB 3-0 SH 27X BRD (SUTURE) ×1 IMPLANT
SUT VIC AB 4-0 SH 27 (SUTURE) ×4
SUT VIC AB 4-0 SH 27XANBCTRL (SUTURE) ×4 IMPLANT
SYR CONTROL 10ML LL (SYRINGE) ×1 IMPLANT
TOWEL OR 17X24 6PK STRL BLUE (TOWEL DISPOSABLE) ×2 IMPLANT
TRAY FOLEY W/BAG SLVR 14FR LF (SET/KITS/TRAYS/PACK) ×1 IMPLANT
WATER STERILE IRR 500ML POUR (IV SOLUTION) ×2 IMPLANT

## 2022-07-20 NOTE — Anesthesia Postprocedure Evaluation (Signed)
Anesthesia Post Note  Patient: Rose Edwards  Procedure(s) Performed: ABDOMINAL MYOMECTOMY INTRAUTERINE DEVICE (IUD) REMOVAL AND INSERTION     Patient location during evaluation: PACU Anesthesia Type: General Level of consciousness: awake and alert Pain management: pain level controlled Vital Signs Assessment: post-procedure vital signs reviewed and stable Respiratory status: spontaneous breathing, nonlabored ventilation, respiratory function stable and patient connected to nasal cannula oxygen Cardiovascular status: blood pressure returned to baseline and stable Postop Assessment: no apparent nausea or vomiting Anesthetic complications: no   No notable events documented.  Last Vitals:  Vitals:   07/20/22 1430 07/20/22 1437  BP: 111/76 112/68  Pulse: 72 71  Resp: (!) 21 20  Temp:  36.6 C  SpO2: 100% 100%    Last Pain:  Vitals:   07/20/22 1457  PainSc: 2                  Earl Lites P Carman Essick

## 2022-07-20 NOTE — H&P (Signed)
Rose Edwards is an 37 y.o. female para 2 with uterine fibroids causing abnormal bleeding and lost IUD strings here for abdominal myomectomy, IUD removal with ultrasound guidance and IUD placement.   Pertinent Gynecological History: Menses:  flow is irregular, prolonged.  Bleeding: None currently.  Contraception: IUD DES exposure: unknown Blood transfusions: none Sexually transmitted diseases: no past history Previous GYN Procedures:  Diagnostic hysteroscopy.    Last mammogram:  N/A   Last pap: Normal OB History: G2, P2002   Menstrual History: No LMP recorded (lmp unknown). (Menstrual status: IUD).    Past Medical History:  Diagnosis Date   Medical history non-contributory     Past Surgical History:  Procedure Laterality Date   CESAREAN SECTION  2012   [place]   CESAREAN SECTION N/A 04/17/2017   Procedure: CESAREAN SECTION;  Surgeon: Conard Novak, MD;  Location: [facility] ORS;  Service: Obstetrics;  Laterality: N/A;   TONSILLECTOMY     As a child in [place]    Family History  Problem Relation Age of Onset   Hypertension Mother     Social History:  reports that she has never smoked. She has never used smokeless tobacco. She reports that she does not drink alcohol and does not use drugs.  Allergies: No Known Allergies  Medications Prior to Admission  Medication Sig Dispense Refill Last Dose   acetaminophen (TYLENOL) 325 MG tablet Take 650 mg by mouth daily as needed for moderate pain or headache.   Past Month   levonorgestrel (MIRENA) 20 MCG/24HR IUD 1 Intra Uterine Device (1 each total) by Intrauterine route once for 1 dose. 1 each 0    Naphazoline-Pheniramine (ALLERGY EYE OP) Place 1 drop into both eyes daily as needed (allergies).   Past Month   aspirin EC 325 MG tablet Take 650 mg by mouth every 6 (six) hours as needed for moderate pain.   More than a month    Review of Systems Constitutional: Denies fevers/chills Cardiovascular: Denies chest pain or  palpitations Pulmonary: Denies coughing or wheezing Gastrointestinal: Denies nausea, vomiting or diarrhea Genitourinary: Denies pelvic pain, unusual vaginal bleeding, unusual vaginal discharge, dysuria, urgency or frequency.  Musculoskeletal: Denies muscle or joint aches and pain.  Neurology: Denies abnormal sensations such as tingling or numbness.   Blood pressure 106/73, pulse 85, temperature 98 F (36.7 C), resp. rate 20, height 5\' 1"  (1.549 m), weight 52.2 kg, SpO2 100 %, currently breastfeeding. Physical Exam Vitals reviewed. Constitutional: She is oriented to person, place, and time. She appears well-developed and well-nourished.  HENT:  Head: Normocephalic and atraumatic.  Eyes: Conjunctivae and EOM are normal.  Neck: Normal range of motion. Neck supple.  Cardiovascular: Normal rate, regular rhythm, normal heart sounds and intact distal pulses.  Respiratory: Effort normal and breath sounds normal.  GI: Soft. She exhibits no mass. There is no tenderness.  Genitourinary: Vagina normal. Uterus is enlarged, 14 week sized.   Musculoskeletal: Normal range of motion.  Neurological: She is alert and oriented to person, place, and time.  Skin: Skin is warm and dry.  Psychiatric: She has a normal mood and affect. Judgment normal.   Results for orders placed or performed during the hospital encounter of 07/20/22 (from the past 24 hour(s))  Pregnancy, urine POC     Status: None   Collection Time: 07/20/22  8:48 AM  Result Value Ref Range   Preg Test, Ur NEGATIVE NEGATIVE    Recent Results (from the past 2160 hour(s))  Type and screen [facility]     Status: None (Preliminary result)   Collection Time: 07/13/22  3:34 PM  Result Value Ref Range   ABO/RH(D) B POS    Antibody Screen POS    Sample Expiration 07/27/2022,2359    Extend sample reason NO TRANSFUSIONS OR PREGNANCY IN THE PAST 3 MONTHS    Antibody Identification NO CLINICALLY SIGNIFICANT ANTIBODY IDENTIFIED     DAT, IgG NEG    Unit Number [id number]    Blood Component Type RED CELLS,LR    Unit division 00    Status of Unit ALLOCATED    Transfusion Status OK TO TRANSFUSE    Crossmatch Result COMPATIBLE    Unit Number [id number]    Blood Component Type RED CELLS,LR    Unit division 00    Status of Unit ALLOCATED    Transfusion Status OK TO TRANSFUSE    Crossmatch Result COMPATIBLE   BPAM RBC     Status: None (Preliminary result)   Collection Time: 07/13/22  3:34 PM  Result Value Ref Range   Blood Product Unit Number [id number]    PRODUCT CODE E0382V00    Unit Type and Rh 7300    Blood Product Expiration Date [id number]    Blood Product Unit Number [id number]    PRODUCT CODE E0382V00    Unit Type and Rh 7300    Blood Product Expiration Date [id number]   CBC per protocol     Status: Abnormal   Collection Time: 07/13/22  3:35 PM  Result Value Ref Range   WBC 4.0 4.0 - 10.5 K/uL   RBC 5.24 (H) 3.87 - 5.11 MIL/uL   Hemoglobin 13.6 12.0 - 15.0 g/dL   HCT 20.2 54.2 - 70.6 %   MCV 79.8 (L) 80.0 - 100.0 fL   MCH 26.0 26.0 - 34.0 pg   MCHC 32.5 30.0 - 36.0 g/dL   RDW 23.7 62.8 - 31.5 %   Platelets 278 150 - 400 K/uL   nRBC 0.0 0.0 - 0.2 %    Comment: Performed at [facility], [address] [place], [place] [address]  Pregnancy, urine POC     Status: None   Collection Time: 07/20/22  8:48 AM  Result Value Ref Range   Preg Test, Ur NEGATIVE NEGATIVE    Comment:        THE SENSITIVITY OF THIS METHODOLOGY IS >24 mIU/mL     - Pelvic Ultrasound in office: Uterus 14 cm with 11 cm posterior intramural/fundal fibroid. Normal adnexae. IUD in place.   Assessment/Plan: 37 y/o P2 with symptomatic fibroids and IUD strings lost, here for abdominal myomectomy, IUD removal under ultrasound guidance and MIrena IUD insertion, - Admit to [facility] OR as per admit orders.  -This procedure has been fully reviewed with the patient and written informed consent has been obtained.   - Discussed with patient risks that include but are not limited to risks of bleeding, infection, damage to organs, blood transfusion, hysterectomy, need for cesarean delivery.  All her questions were answered and she expressed understanding.   Prescilla Sours 07/20/2022, 10:38 AM

## 2022-07-20 NOTE — Anesthesia Procedure Notes (Addendum)
Procedure Name: Intubation Date/Time: 07/20/2022 11:03 AM  Performed by: Shary Decamp, CRNAPre-anesthesia Checklist: Patient identified, Patient being monitored, Timeout performed, Emergency Drugs available and Suction available Patient Re-evaluated:Patient Re-evaluated prior to induction Oxygen Delivery Method: Circle System Utilized Preoxygenation: Pre-oxygenation with 100% oxygen Induction Type: IV induction Ventilation: Mask ventilation without difficulty Laryngoscope Size: Miller and 2 Grade View: Grade I Tube type: Oral Tube size: 7.0 mm Number of attempts: 1 Airway Equipment and Method: Stylet Placement Confirmation: ETT inserted through vocal cords under direct vision, positive ETCO2 and breath sounds checked- equal and bilateral Secured at: 21 cm Tube secured with: Tape Dental Injury: Teeth and Oropharynx as per pre-operative assessment

## 2022-07-20 NOTE — Anesthesia Preprocedure Evaluation (Signed)
Anesthesia Evaluation  Patient identified by MRN, date of birth, ID band Patient awake    Reviewed: Allergy & Precautions, NPO status , Patient's Chart, lab work & pertinent test results  Airway Mallampati: II  TM Distance: >3 FB Neck ROM: Full    Dental no notable dental hx.    Pulmonary neg pulmonary ROS   Pulmonary exam normal        Cardiovascular negative cardio ROS  Rhythm:Regular Rate:Normal     Neuro/Psych negative neurological ROS  negative psych ROS   GI/Hepatic negative GI ROS, Neg liver ROS,,,  Endo/Other  negative endocrine ROS    Renal/GU negative Renal ROS  Female GU complaint     Musculoskeletal negative musculoskeletal ROS (+)    Abdominal Normal abdominal exam  (+)   Peds  Hematology Lab Results      Component                Value               Date                      WBC                      4.0                 07/13/2022                HGB                      13.6                07/13/2022                HCT                      41.8                07/13/2022                MCV                      79.8 (L)            07/13/2022                PLT                      278                 07/13/2022             Lab Results      Component                Value               Date                      NA                       138                 02/01/2016                K  4.0                 02/01/2016                CO2                      23                  02/01/2016                GLUCOSE                  92                  02/01/2016                BUN                      15                  02/01/2016                CREATININE               0.72                02/01/2016                CALCIUM                  9.2                 02/01/2016                GFRNONAA                 >60                 02/01/2016              Anesthesia Other Findings    Reproductive/Obstetrics                             Anesthesia Physical Anesthesia Plan  ASA: 2  Anesthesia Plan: General   Post-op Pain Management: Celebrex PO (pre-op)* and Tylenol PO (pre-op)*   Induction: Intravenous  PONV Risk Score and Plan: 3 and Ondansetron, Dexamethasone, Midazolam and Treatment may vary due to age or medical condition  Airway Management Planned: Mask and Oral ETT  Additional Equipment: None  Intra-op Plan:   Post-operative Plan: Extubation in OR  Informed Consent: I have reviewed the patients History and Physical, chart, labs and discussed the procedure including the risks, benefits and alternatives for the proposed anesthesia with the patient or authorized representative who has indicated his/her understanding and acceptance.     Dental advisory given  Plan Discussed with: CRNA  Anesthesia Plan Comments:        Anesthesia Quick Evaluation

## 2022-07-20 NOTE — Op Note (Addendum)
Name:  Rose Edwards DOB:   Nov 27, 1985 MRN: [id number]  Pre-op diagnosis: 1. 11 cm fibroid posterior with intramural, subserosal ans submucosal components (FIGO Type 2- 5).  2. Irregular and prolonged periods. 3. IUD strings lost.  4. Encounter for IUD removal and insertion.     Post-op diagnosis: Same as above.   Procedures:  Abdominal myomectomy. IUD removal and reinsertion.   Anesthesia:  General ETA- Dr. Hester Mates.   Surgeon: Dr. Hoover Browns.   Assistant: Herma Mering, RNFA  Complications: None  Findings:  Uterus with one large left posterior uterine fibroids, about 11 cm in size, 434 grams total.     Moderate adhesions between anterior uterus and bladder.  Normal bilateral ovaries and fallopian tubes. Normal bowels.    EBL: 760 cc.   IVF: 2L LR and 500 cc albumin.   Urine output:  150 cc clear urine.   Indications: 37 y/o P2 here for abdominal myomectomy for symptomatic fibroids, IUD removal and reinsertion.    PROCEDURE: Informed consent was obtained from the patient to undergo the procedures after explaining the risks, benefits and alternatives of the procedures.  She was prepped vaginally and abdominally and draped in the usual sterile fashion and a foley catheter was already placed in.    IV Ancef and IV tranexamic were given pre-operatively.  A pfannenstiel incision was made with the scalpel over the prior Pfannenstiel incision and carried through the underlying subcutaneous layer and fascia with the bovie.  Small perforators were contained with the bovie.  The fascia was nicked in the midline and the fascia separated from the rectus muscles superiorly, inferiorly and bilaterally.  Rectus muscle was separated in the midline and peritoneum entered bluntly.  The uterus was inspected and the fibroid noted.  The Alexis retractor was placed in.  The bowel was packed away.  The uterus was mobilized out of the pelvis with the single tooth tenaculum placed fundally into  the fibroid.  With the uterus mobilized it was difficult to access the lower part of the uterus  therefore the penrose torniquet was not placed in. Satinsky clamp could only be placed over the left infundibulopelvic (IP) ligament but not the right one due to limited accessibility to the IPs.  Vasopressin (20 units mixed in 100 cc NS) was injected over and under the large posterior fibroid, total used was about 50 cc.  The fibroid was incised horizontally with the bovie near the normal uterus and fundal location.  The fibroid and pseudocapsule was separated from the sorrounding myometrium with moist gauze pad, hemostat and bovie.  The fibroid was noted to be extending to the endometrium and with its removal endometrial entry was noted.  The IUD arms was seen protruding through this defect, the  IUD arm was grasped with the kelly clamp and removed in its entirety.  The endometrium was closed off with 3-0 vicryl in a running stitch.  The large uterine defect was then closed with 0-vicryl in 2 thin layers, with the second layer imbricating over the first one. The myometrium that was was put back together was noted to be thin (about 1 cm thick) and of small amount as the fibroid had been occupying most of the left posterior wall of the uterus.  The excess serosa was trimmed off.  Dead space from the loose serosa was closed off with a figure of eight suture and then the serosa was closed off in baseball stitch with 3-0 monocryl.  In case of future pregnancy  a cesarean section is recommended at 36 of weeks of pregnancy or earlier given the thin myometrial layers available for closure of the uterine defect on the left posterior uterus.  The pelvis was irrigated and suctioned out.   Interceed was then placed over the myometrium incision where the fibroid had been removed.  Jon Gills was removed, uterus returned into the pelvis and the muscle and peritoneum was reapproximated with 2-0-chromic. The fascia was closed off using  0-vicryl in a running stitch. The subcutaneous layer was closed off with 2-0 plain suture.  20 cc of 1% lidocaine was injected over the incision.  The skin was closed off with 4-0 vicryl on a keith needle in a subcuticular stitch.  Benzocaine, steri strips and honey comb dressing were then placed over the incision.  The patient was  placed in the dorsal lithotomy position and graves speculum was placed in.  Single tooth tenaculum was placed at the anterior cervix and uterus sounded to 7 cm. Mirena IUD was placed in in the usual fashion.  IUD strings were trimmed.  Patient was then awoken from anesthesia and taken to the recovery room in stable condition.  Dr. Hoover Browns.  07/20/2022.

## 2022-07-20 NOTE — Interval H&P Note (Signed)
History and Physical Interval Note:  07/20/2022 10:44 AM  Rose Edwards  has presented today for surgery, with the diagnosis of IUD  THREAD LOST, uterine leiomyoma.  The various methods of treatment have been discussed with the patient and family. After consideration of risks, benefits and other options for treatment, the patient has consented to  Procedure(s): HYSTEROSCOPY (N/A) ABDOMINAL MYOMECTOMY (N/A) OPERATIVE ULTRASOUND (N/A) INTRAUTERINE DEVICE (IUD) REMOVAL AND INSERTION (N/A) as a surgical intervention.  The patient's history has been reviewed, patient examined, no change in status, stable for surgery.  I have reviewed the patient's chart and labs.  Questions were answered to the patient's satisfaction.     Xitlalli Newhard W Marleah Beever,MD.

## 2022-07-20 NOTE — Progress Notes (Signed)
Patient arrived to 6 north room 19 alert and oriented x4. Pain level 2/10. Honeycomb dressing in place with scant drainage marked. Foley removed per order. Lunch tray ordered. Bed in lowest position. Call light in reach. All needs met at this time. Will continue to monitor pt.

## 2022-07-20 NOTE — Transfer of Care (Signed)
Immediate Anesthesia Transfer of Care Note  Patient: Rose Edwards  Procedure(s) Performed: ABDOMINAL MYOMECTOMY INTRAUTERINE DEVICE (IUD) REMOVAL AND INSERTION  Patient Location: PACU  Anesthesia Type:General  Level of Consciousness: drowsy, patient cooperative, and responds to stimulation  Airway & Oxygen Therapy: Patient Spontanous Breathing and Patient connected to nasal cannula oxygen  Post-op Assessment: Report given to RN and Patient moving all extremities X 4  Post vital signs: Reviewed and stable  Last Vitals:  Vitals Value Taken Time  BP 102/65 07/20/22 1325  Temp    Pulse 83 07/20/22 1326  Resp 22 07/20/22 1329  SpO2 99 % 07/20/22 1326  Vitals shown include unvalidated device data.  Last Pain:  Vitals:   07/20/22 0841  PainSc: 0-No pain      Patients Stated Pain Goal: 0 (07/20/22 0841)  Complications: No notable events documented.

## 2022-07-21 ENCOUNTER — Encounter (HOSPITAL_COMMUNITY): Payer: Self-pay | Admitting: Obstetrics & Gynecology

## 2022-07-21 LAB — BASIC METABOLIC PANEL
Anion gap: 6 (ref 5–15)
BUN: 5 mg/dL — ABNORMAL LOW (ref 6–20)
CO2: 22 mmol/L (ref 22–32)
Calcium: 8.4 mg/dL — ABNORMAL LOW (ref 8.9–10.3)
Chloride: 108 mmol/L (ref 98–111)
Creatinine, Ser: 0.63 mg/dL (ref 0.44–1.00)
GFR, Estimated: >60 mL/min (ref >60)
Glucose, Bld: 131 mg/dL — ABNORMAL HIGH (ref 70–99)
Potassium: 3.7 mmol/L (ref 3.5–5.1)
Sodium: 136 mmol/L (ref 135–145)

## 2022-07-21 MED ORDER — OXYCODONE HCL 5 MG PO TABS: 5.0000 mg | ORAL_TABLET | ORAL | 0 refills | Status: AC | PRN

## 2022-07-21 MED ORDER — IBUPROFEN 600 MG PO TABS: 600.0000 mg | ORAL_TABLET | Freq: Four times a day (QID) | ORAL | 0 refills | Status: AC

## 2022-07-21 MED ORDER — POLYSACCHARIDE IRON COMPLEX 150 MG PO CAPS
150.0000 mg | ORAL_CAPSULE | Freq: Every day | ORAL | Status: DC
  Filled 2022-07-21: qty 1

## 2022-07-21 MED ORDER — POLYSACCHARIDE IRON COMPLEX 150 MG PO CAPS: 150.0000 mg | ORAL_CAPSULE | ORAL | 0 refills | Status: AC

## 2022-07-21 MED ORDER — DOCUSATE SODIUM 100 MG PO CAPS: 100.0000 mg | ORAL_CAPSULE | Freq: Two times a day (BID) | ORAL | 1 refills | Status: AC | PRN

## 2022-07-21 NOTE — Progress Notes (Signed)
Regular breakfast tray ordered.  

## 2022-07-21 NOTE — Progress Notes (Signed)
Discharge instructions given to pt. Pt verbalized understanding and had no further questions. 

## 2022-07-21 NOTE — TOC Initial Note (Signed)
Transition of Care [facility]) - Initial/Assessment Note    Patient Details  Name: Rose Edwards MRN: [id number] Date of Birth: [birth year]  Transition of Care [facility]) CM/SW Contact:    Kingsley Plan, RN Phone Number: 07/21/2022, 1:35 PM  Clinical Narrative:                  Spoke to patient at bedside. From home with husband. Does not have a PCP agreeable to a follow up appointment at a [facility]. Appointment scheduled and information placed on AVS .   No other discharge needs identified at this time     TOC  will continue to monitor patient advancement through interdisciplinary progression rounds. If new patient transition needs arise, please place a TOC consult.   Expected Discharge Plan: Home/Self Care Barriers to Discharge: Continued Medical Work up   Patient Goals and CMS Choice Patient states their goals for this hospitalization and ongoing recovery are:: to return to home     [facility] ownership interest in [facility].provided to:: Patient    Expected Discharge Plan and Services   Discharge Planning Services: CM Consult   Living arrangements for the past 2 months: Single Family Home                 DME Arranged: N/A         HH Arranged: NA          Prior Living Arrangements/Services Living arrangements for the past 2 months: Single Family Home Lives with:: Spouse Patient language and need for interpreter reviewed:: Yes Do you feel safe going back to the place where you live?: Yes      Need for Family Participation in Patient Care: Yes (Comment) Care giver support system in place?: Yes (comment)   Criminal Activity/Legal Involvement Pertinent to Current Situation/Hospitalization: No - Comment as needed  Activities of Daily Living      Permission Sought/Granted   Permission granted to share information with : No              Emotional Assessment Appearance:: Appears stated age Attitude/Demeanor/Rapport: Engaged Affect (typically  observed): Accepting Orientation: : Oriented to Self, Oriented to Place, Oriented to  Time, Oriented to Situation Alcohol / Substance Use: Not Applicable Psych Involvement: No (comment)  Admission diagnosis:  S/P myomectomy [Z98.890] Patient Active Problem List   Diagnosis Date Noted   S/P myomectomy 07/20/2022   Normal labor 04/17/2017   Indication for care in labor and delivery, antepartum 04/16/2017   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 [place] [address] Phone: [phone] Fax: [phone]  [id number] - [place], [place] - [address] [place] [address] Phone: [phone] Fax: [phone]     Social Determinants of Health (SDOH) Social History: SDOH Screenings   Tobacco Use: Low Risk  (07/21/2022)   SDOH Interventions:     Readmission Risk Interventions     No data to display

## 2022-07-21 NOTE — Discharge Summary (Signed)
Physician Discharge Summary  Patient ID: Rose Edwards MRN: [id number] DOB/AGE: [birth year] 37 y.o.  Admit date: 07/20/2022 Discharge date: 07/21/2022  Admission Diagnoses:  11 cm fibroid with intramural and subserosal components.  Lost IUD strings. Desires IUD removal and IUD reinsertion.   Discharge Diagnoses:  Principal Problem:   S/P myomectomy S/p Abdominal myomectomy, IUD removal and reinsertion.   Discharged Condition: good  Hospital Course: 37 y/o Para 2 who was admitted for an abdominal myomectomy, Mirena IUD removal and reinsertion. The procedures were done without complications, please see op note for more details. Post-operatively she did well. By POD # 1 she was tolerating a regular diet and passing gas. She was was ambulating and voiding without difficulty. She desired to be discharged and was deemed stable for discharge.   Consults: None  Significant Diagnostic Studies: labs:     Latest Ref Rng & Units 07/20/2022    3:43 PM 07/20/2022    1:42 PM 07/13/2022    3:35 PM  CBC  WBC 4.0 - 10.5 K/uL 11.3   4.0   Hemoglobin 12.0 - 15.0 g/dL 9.8  9.9  08.6   Hematocrit 36.0 - 46.0 % 31.4  29.0  41.8   Platelets 150 - 400 K/uL 201   278      Treatments:  Abdominal myomectomy. 2. MIrena IUD removal and reinsertion.   Discharge Exam: Blood pressure (!) 87/49, pulse 69, temperature 98.2 F (36.8 C), temperature source Oral, resp. rate 18, height 5\' 1"  (1.549 m), weight 52.2 kg, SpO2 99 %, currently breastfeeding.    07/21/2022    2:09 PM 07/21/2022    8:16 AM 07/21/2022    4:38 AM  Vitals with BMI  Systolic 87 93 97  Diastolic 49 61 59  Pulse 69 74 89    General appearance: alert, cooperative, and no distress Cardio: regular rate and rhythm, S1, S2 normal, no murmur, click, rub or gallop GI: soft, non-tender; bowel sounds normal; no masses,  no organomegaly Extremities: extremities normal, atraumatic, no cyanosis or edema Incision/Wound: With honey comb dressing.  Small  dried blood at right edge of incision, otherwise clean, dry and intact.   Disposition:  Home  Allergies as of 07/21/2022   No Known Allergies      Medication List     STOP taking these medications    aspirin EC 325 MG tablet       TAKE these medications    acetaminophen 325 MG tablet Commonly known as: TYLENOL Take 650 mg by mouth daily as needed for moderate pain or headache.   ALLERGY EYE OP Place 1 drop into both eyes daily as needed (allergies).   docusate sodium 100 MG capsule Commonly known as: COLACE Take 1 capsule (100 mg total) by mouth 2 (two) times daily as needed for mild constipation or moderate constipation.   ibuprofen 600 MG tablet Commonly known as: ADVIL Take 1 tablet (600 mg total) by mouth every 6 (six) hours. Start taking on: Jul 22, 2022   levonorgestrel 20 MCG/24HR IUD Commonly known as: MIRENA 1 Intra Uterine Device (1 each total) by Intrauterine route once for 1 dose.   oxyCODONE 5 MG immediate release tablet Commonly known as: Oxy IR/ROXICODONE Take 1-2 tablets (5-10 mg total) by mouth every 4 (four) hours as needed for up to 30 doses for moderate pain.      Iron Polysaccharide 150 mg 1 oral tablet every other day. # 30.    Follow-up Information     Elaina Pattee,  Shanda Bumps, MD Follow up.   Specialty: Internal Medicine Why: Aug 05, 2022 at 1045 am Contact information: [address] [place] [address] [phone]         Hoover Browns, MD. Go on 08/05/2022.   Specialty: Obstetrics and Gynecology Why: 2 week post-op check. Contact information: [address] [place] [address] [phone]                 Signed: Prescilla Sours, MD.  07/21/2022, 5:29 PM

## 2022-07-21 NOTE — Discharge Instructions (Signed)
  Abbeygail, 1. While at home remember to walk regularly, at least half an hour to1 hour a day in addition to your activities of daily living, as this will help with your quick recovery. 2. Do not do any heavy lifting, i.e nothing heavier than 10 lbs for the next 6 weeks. 3.  Do not use tampons or douche or take baths, do not have any sexual intercourse or anything inside the vagina for the next 6 weeks.  4. Take your pain medication as needed for pain, let me know if the pain is not well controlled despite pain medication use.  5. Get plenty of rest especially for the next two weeks to allow your body to recover.  You may feel tired in the process, this is normal. 6. You may take a stool softener e.g colace if you are constipated.    7.  You may get a fever while at home, if you do, check your temperature and if it is equal to or greater than 100.4 you may take tylenol.  If you continue with the fever after the tylenol please call the office.   8.  You may shower as usual even while you have the honey comb dressing and ensure to pat the dressing dry.  Do not put soap directly over the dressing or rub it.    9.  Remove the Honey comb dressing one week from your surgery date. To remove the honey comb dressing, first take a shower.  After showering pat the dressing dry then peel it off gently from the corners.  After honey comb comes out there will be steri strips left on the incision.  You may continue showering as usual and the steri strips may get wet.  After showering pat the steri strips dry. Allow the steri strips to fall off by themselves over time.  Do not rub the incision directly or put soap directly over the incision.  Always rinse off any soap over the incision and pat the incision dry.  10.  Some vaginal bleeding is expected and normal after your surgery. Please let me know if if it excessive where you saturate 1 pad in less than 2 hours or so.   I wish you a quick and safe recovery.  Dr.  [facility] [phone], office extension [phone].

## 2022-07-22 LAB — BPAM RBC
Blood Product Expiration Date: 202406032359
Blood Product Expiration Date: 202406032359
Unit Type and Rh: 7300
Unit Type and Rh: 7300

## 2022-07-22 LAB — TYPE AND SCREEN
ABO/RH(D): B POS
Antibody Screen: POSITIVE
DAT, IgG: NEGATIVE
Unit division: 0
Unit division: 0

## 2022-07-22 LAB — SURGICAL PATHOLOGY

## 2022-08-05 ENCOUNTER — Ambulatory Visit: Payer: 59 | Admitting: Student
